# Patient Record
Sex: Male | Born: 1970
Health system: Southern US, Community
[De-identification: ages and names within clinical notes are randomized; demographics above are authoritative.]

## PROBLEM LIST (undated history)

## (undated) DIAGNOSIS — F329 Major depressive disorder, single episode, unspecified: Secondary | ICD-10-CM

## (undated) DIAGNOSIS — F32A Depression, unspecified: Secondary | ICD-10-CM

## (undated) DIAGNOSIS — F419 Anxiety disorder, unspecified: Secondary | ICD-10-CM

## (undated) HISTORY — PX: NO PAST SURGERIES: SHX2092

---

## 2003-05-22 ENCOUNTER — Emergency Department (HOSPITAL_COMMUNITY): Admission: EM | Admit: 2003-05-22 | Discharge: 2003-05-22 | Payer: Self-pay | Admitting: Emergency Medicine

## 2007-08-21 ENCOUNTER — Emergency Department (HOSPITAL_COMMUNITY): Admission: EM | Admit: 2007-08-21 | Discharge: 2007-08-21 | Payer: Self-pay | Admitting: Emergency Medicine

## 2007-12-16 ENCOUNTER — Emergency Department (HOSPITAL_COMMUNITY): Admission: EM | Admit: 2007-12-16 | Discharge: 2007-12-16 | Payer: Self-pay | Admitting: Emergency Medicine

## 2008-10-28 IMAGING — CR DG CHEST 1V PORT
1 series · 1 of 1 positions shown · non-contrast
Comparison: None.

CLINICAL DATA: Shortness breath.]

PORTABLE CHEST - 1 VIEW

[view not recorded]
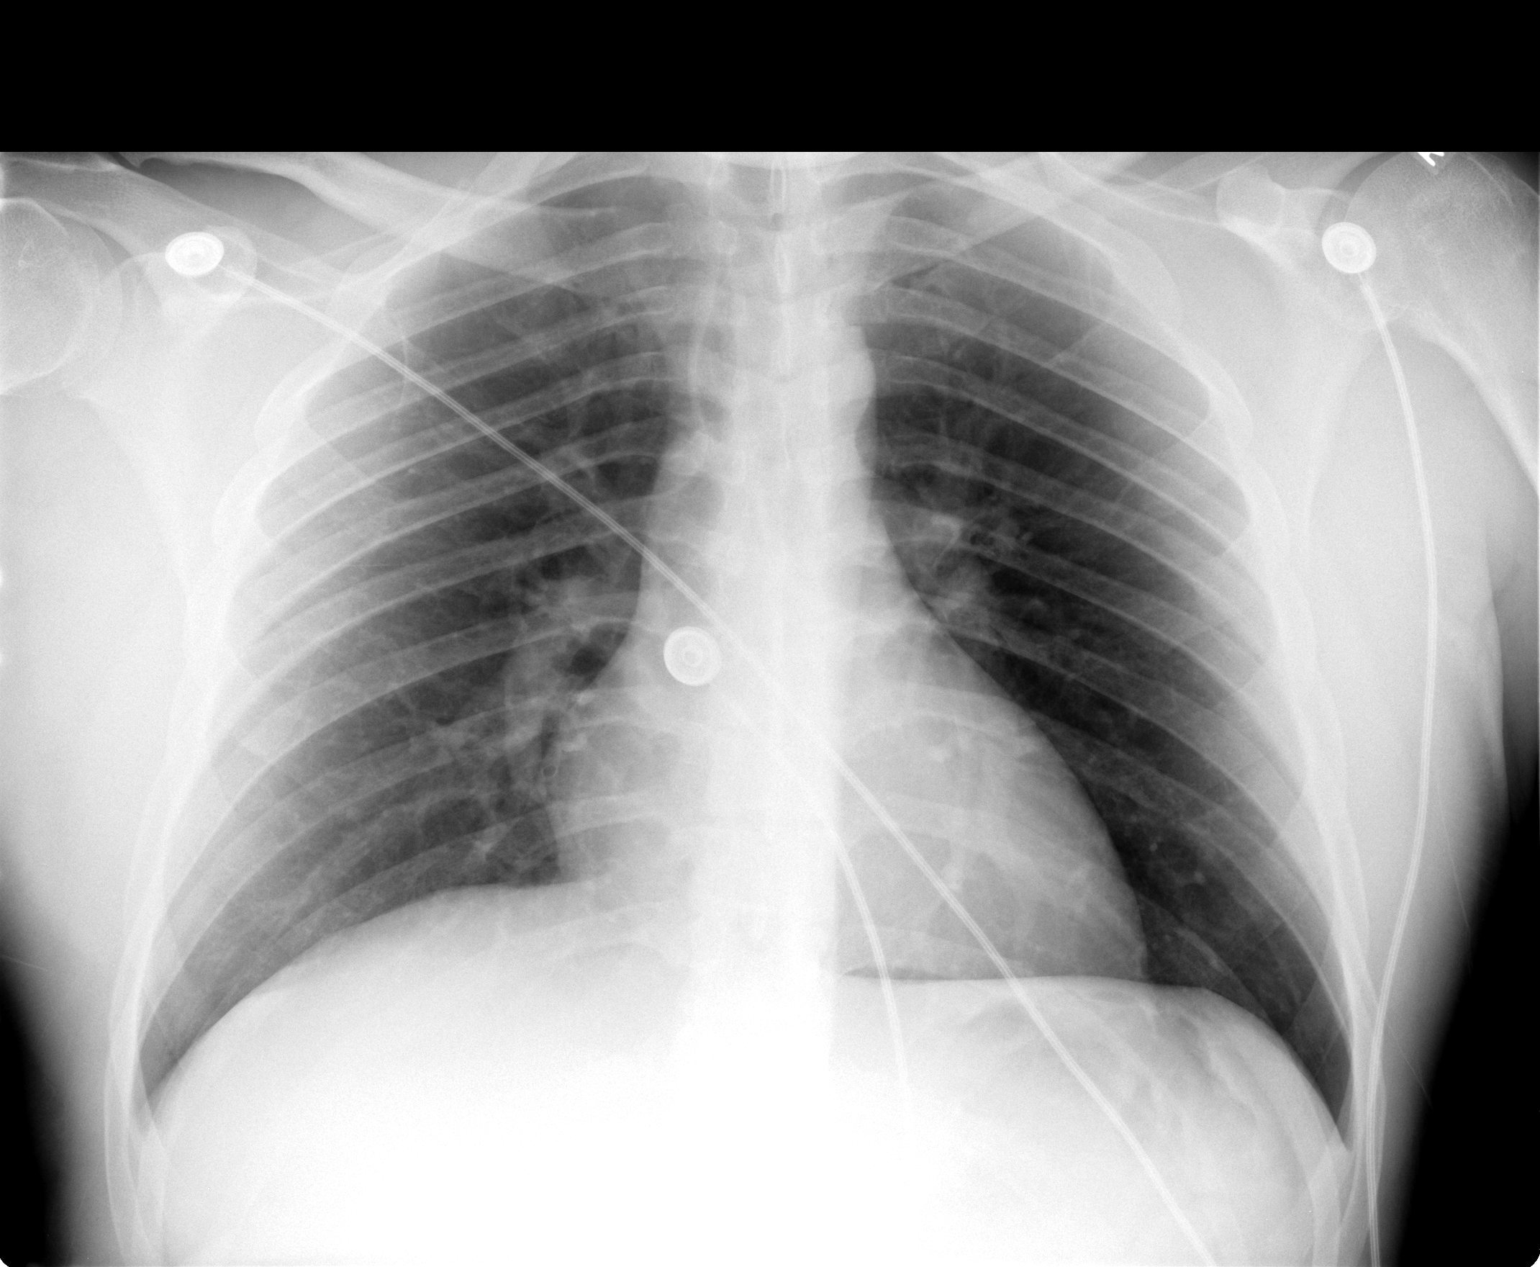

[1 of 1 positions shown; findings below may reference images not displayed]

FINDINGS: No infiltrate, congestive heart failure or pneumothorax.
Question 1.2 cm nodule left lung apex versus overlapping
structures.  This can be evaluated on follow-up two-view exam.

Heart size top normal.  Mediastinal structures within normal
limits.
IMPRESSION: No infiltrate, congestive heart failure or pneumothorax.

Question 1.2 cm left apical nodule versus overlapping structures.
Follow-up two-view exam recommended for further delineation.

## 2009-10-19 ENCOUNTER — Emergency Department (HOSPITAL_COMMUNITY): Admission: EM | Admit: 2009-10-19 | Discharge: 2009-10-20 | Payer: Self-pay | Admitting: Emergency Medicine

## 2011-01-22 LAB — COMPREHENSIVE METABOLIC PANEL
Albumin: 3.9
BUN: 13
Creatinine, Ser: 1.38
GFR calc Af Amer: >60
Potassium: 3.9
Total Protein: 6.9

## 2011-01-22 LAB — POCT CARDIAC MARKERS
CKMB, poc: <1 — ABNORMAL LOW
Myoglobin, poc: 36.2
Operator id: 3067
Troponin i, poc: <0.05

## 2011-01-22 LAB — CBC
HCT: 40.3
MCV: 89.1
Platelets: 208
RDW: 13.8

## 2011-01-22 LAB — DIFFERENTIAL
Lymphocytes Relative: 28
Monocytes Absolute: 0.6
Monocytes Relative: 9
Neutro Abs: 4.2

## 2011-01-22 LAB — APTT: aPTT: 30

## 2015-10-08 ENCOUNTER — Emergency Department (HOSPITAL_COMMUNITY)
Admission: EM | Admit: 2015-10-08 | Discharge: 2015-10-08 | Disposition: A | Discharge status: Home / Self Care | Payer: Self-pay | Attending: Emergency Medicine | Admitting: Emergency Medicine

## 2015-10-08 ENCOUNTER — Encounter (HOSPITAL_COMMUNITY): Payer: Self-pay | Admitting: Emergency Medicine

## 2015-10-08 DIAGNOSIS — K029 Dental caries, unspecified: Secondary | ICD-10-CM

## 2015-10-08 DIAGNOSIS — Z720 Tobacco use: Secondary | ICD-10-CM

## 2015-10-08 DIAGNOSIS — K047 Periapical abscess without sinus: Secondary | ICD-10-CM

## 2015-10-08 MED ORDER — HYDROCODONE-ACETAMINOPHEN 5-325 MG PO TABS: 1.0000 | ORAL_TABLET | Freq: Four times a day (QID) | ORAL | Status: DC | PRN

## 2015-10-08 MED ORDER — NAPROXEN 500 MG PO TABS: 500.0000 mg | ORAL_TABLET | Freq: Two times a day (BID) | ORAL | Status: DC | PRN

## 2015-10-08 MED ORDER — AMOXICILLIN-POT CLAVULANATE 875-125 MG PO TABS: 1.0000 | ORAL_TABLET | Freq: Two times a day (BID) | ORAL | Status: DC

## 2015-10-08 NOTE — ED Provider Notes (Signed)
CSN: [id number]     Arrival date & time 10/08/15  1313 History  By signing my name below, I, Linna Darner, attest that this documentation has been prepared under the direction and in the presence of [address] [employer]. Electronically Signed: Linna Darner, Scribe. 10/08/2015. 1:25 PM.   Chief Complaint  Patient presents with  . Dental Pain    Patient is a 45 y.o. male presenting with tooth pain. The history is provided by the patient. No language interpreter was used.  Dental Pain Location:  Lower Lower teeth location:  18/LL 2nd molar Quality:  Throbbing and radiating Severity:  Severe Onset quality:  Gradual Duration:  1 week Timing:  Constant Progression:  Worsening Chronicity:  New Context: dental fracture   Relieved by:  NSAIDs and topical anesthetic gel Exacerbated by: chewing left side. Ineffective treatments:  None tried Associated symptoms: facial pain   Associated symptoms: no difficulty swallowing, no drooling, no facial swelling, no fever, no gum swelling, no neck swelling and no trismus   Risk factors: lack of dental care and smoking      HPI Comments: Zachary Lindsey is a 45 y.o. otherwise healthy male who presents to the Emergency Department complaining of constant, worsening, throbbing, 9/10 left lower dental pain radiating throughout the left side of his face for the last two weeks, worse over the last 48hrs. He states that he chipped the painful tooth several months ago. Pt states that his pain is exacerbated by chewing on his left side. He has tried [employer], Orajel, and ibuprofen for his pain; he states that these treatments provided moderate relief of his pain initially but have not worked recently. Pt does not currently have a dentist or dental insurance; his last visit to a dentist was 3 to 4 years ago. Pt is a smoker. He denies drainage from his gums, swelling in his gums, facial/neck swelling, drooling, trismus,  trouble swallowing, ear pain, ear  drainage, fever, chills, CP, SOB, abdominal pain, nausea, vomiting, diarrhea, constipation, dysuria, hematuria, numbness/tingling, weakness, rash, myalgias, arthralgias, or any other associated symptoms.  History reviewed. No pertinent past medical history. No past surgical history on file. History reviewed. No pertinent family history. Social History  Substance Use Topics  . Smoking status: None  . Smokeless tobacco: None  . Alcohol Use: None    Review of Systems  Constitutional: Negative for fever and chills.  HENT: Positive for dental problem (left lower molar #18). Negative for drooling, ear discharge, ear pain, facial swelling and trouble swallowing.   Respiratory: Negative for shortness of breath.   Cardiovascular: Negative for chest pain.  Gastrointestinal: Negative for nausea, vomiting, abdominal pain, diarrhea and constipation.  Genitourinary: Negative for dysuria and hematuria.  Musculoskeletal: Negative for myalgias and arthralgias.  Skin: Negative for color change and rash.  Allergic/Immunologic: Negative for immunocompromised state.  Neurological: Negative for weakness and numbness.  Psychiatric/Behavioral: Negative for confusion.   10 Systems reviewed and all are negative for acute change except as noted in the HPI.  Allergies  Review of patient's allergies indicates no known allergies.  Home Medications   Prior to Admission medications   Not on File   BP 132/80 mmHg  Pulse 104  Temp(Src) 98.7 F (37.1 C) (Oral)  Resp 18  SpO2 97% Physical Exam  Constitutional: He is oriented to person, place, and time. Vital signs are normal. He appears well-developed and well-nourished.  Non-toxic appearance. No distress.  Afebrile, nontoxic, NAD  HENT:  Head: Normocephalic and atraumatic.  Nose: Nose normal.  Mouth/Throat: Uvula is midline, oropharynx is clear and moist and mucous membranes are normal. No trismus in the jaw. Dental caries present. No dental abscesses or  uvula swelling.    Left lower molar #18 decayed and mildly TTP, with no surrounding gingival erythema or swelling, no abscess or evidence of Ludwig's. Nose clear. Oropharynx clear and moist, without uvular swelling or deviation, no trismus or drooling, no tonsillar swelling or erythema, no exudates.   Eyes: Conjunctivae and EOM are normal. Right eye exhibits no discharge. Left eye exhibits no discharge.  Neck: Normal range of motion. Neck supple.  Cardiovascular: Normal rate.   Pulmonary/Chest: Effort normal. No respiratory distress.  Abdominal: Normal appearance. He exhibits no distension.  Musculoskeletal: Normal range of motion.  Neurological: He is alert and oriented to person, place, and time. He has normal strength. No sensory deficit.  Skin: Skin is warm, dry and intact. No rash noted.  Psychiatric: He has a normal mood and affect.  Nursing note and vitals reviewed.   ED Course  Procedures (including critical care time)  DIAGNOSTIC STUDIES: Oxygen Saturation is 97% on RA, normal by my interpretation.    COORDINATION OF CARE: 1:25 PM Discussed treatment plan with pt at bedside and pt agreed to plan.  Labs Review Labs Reviewed - No data to display  Imaging Review No results found. I have personally reviewed and evaluated these images and lab results as part of my medical decision-making.   EKG Interpretation None      MDM   Final diagnoses:  Pain due to dental caries  Dental infection  Tobacco use    45 y.o. male here with Dental pain associated with dental decay and possible dental infection with patient afebrile, non toxic appearing and swallowing secretions well. I gave patient referral to dentist and stressed the importance of dental follow up for ultimate management of dental pain.  I have also discussed reasons to return immediately to the ER.  Patient expresses understanding and agrees with plan.  I will also give augmentin and pain control. Smoking cessation  advised.   I personally performed the services described in this documentation, which was scribed in my presence. The recorded information has been reviewed and is accurate.  BP 132/80 mmHg  Pulse 104  Temp(Src) 98.7 F (37.1 C) (Oral)  Resp 18  SpO2 97%  Meds ordered this encounter  Medications  . amoxicillin-clavulanate (AUGMENTIN) 875-125 MG tablet    Sig: Take 1 tablet by mouth 2 (two) times daily. One po bid x 7 days    Dispense:  14 tablet    Refill:  0    Order Specific Question:  Supervising Provider    Answer:  Hyacinth Meeker, BRIAN [[id number]]  . HYDROcodone-acetaminophen (NORCO) 5-325 MG tablet    Sig: Take 1 tablet by mouth every 6 (six) hours as needed for severe pain.    Dispense:  6 tablet    Refill:  0    Order Specific Question:  Supervising Provider    Answer:  MILLER, BRIAN [[id number]]  . naproxen (NAPROSYN) 500 MG tablet    Sig: Take 1 tablet (500 mg total) by mouth 2 (two) times daily as needed for mild pain, moderate pain or headache (TAKE WITH MEALS.).    Dispense:  20 tablet    Refill:  0    Order Specific Question:  Supervising Provider    Answer:  Eber Hong [[id number]]     Manvir Prabhu Camprubi-Soms, PA-C 10/08/15 1344  Nicholos Johns  Clarene DukeMcManus, DO 10/11/15 2258

## 2015-10-08 NOTE — ED Notes (Signed)
Pt states he has had L upper dental pain x 1 week where a tooth chipped off approx. 6-7 months ago. Worsening pain in past 48 hours. Alert and oriented.

## 2015-10-08 NOTE — Discharge Instructions (Signed)
Apply warm compresses to jaw throughout the day. Take antibiotic until finished. Use tylenol or motrin as needed for pain, using norco as directed as needed for additional pain relief but don't drive or operate machinery while taking this medication. Perform salt water swishes to help with pain/swelling. Followup with a dentist is very important for ongoing evaluation and management of recurrent dental pain, call the dentist listed above in the next 24-48 hours to schedule ongoing dental care, or use the list below to find a dentist. Stop smoking! Return to emergency department for emergent changing or worsening symptoms.   Dental Caries Dental caries is tooth decay. This decay can cause a hole in teeth (cavity) that can get bigger and deeper over time. HOME CARE  Brush and floss your teeth. Do this at least two times a day.  Use a fluoride toothpaste.  Use a mouth rinse if told by your dentist or doctor.  Eat less sugary and starchy foods. Drink less sugary drinks.  Avoid snacking often on sugary and starchy foods. Avoid sipping often on sugary drinks.  Keep regular checkups and cleanings with your dentist.  Use fluoride supplements if told by your dentist or doctor.  Allow fluoride to be applied to teeth if told by your dentist or doctor.   This information is not intended to replace advice given to you by your health care provider. Make sure you discuss any questions you have with your health care provider.   Document Released: 01/23/2008 Document Revised: 05/06/2014 Document Reviewed: 04/17/2012 Elsevier Interactive Patient Education 2016 Elsevier Inc.  Dental Pain Dental pain may be caused by many things, including:  Tooth decay (cavities or caries). Cavities cause the nerve of your tooth to be open to air and hot or cold temperatures. This can cause pain or discomfort.  Abscess or infection. A dental abscess is an area that is full of infected pus from a bacterial infection in  the inner part of the tooth (pulp). It usually happens at the end of the tooth's root.  Injury.  An unknown reason (idiopathic). Your pain may be mild or severe. It may only happen when:  You are chewing.  You are exposed to hot or cold temperature.  You are eating or drinking sugary foods or beverages, such as:  Soda.  Candy. Your pain may also be there all of the time. HOME CARE Watch your dental pain for any changes. Do these things to lessen your discomfort:  Take medicines only as told by your dentist.  If your dentist tells you to take an antibiotic medicine, finish all of it even if you start to feel better.  Keep all follow-up visits as told by your dentist. This is important.  Do not apply heat to the outside of your face.  Rinse your mouth or gargle with salt water if told by your dentist. This helps with pain and swelling.  You can make salt water by adding  tsp of salt to 1 cup of warm water.  Apply ice to the painful area of your face:  Put ice in a plastic bag.  Place a towel between your skin and the bag.  Leave the ice on for 20 minutes, 2-3 times per day.  Avoid foods or drinks that cause you pain, such as:  Very hot or very cold foods or drinks.  Sweet or sugary foods or drinks. GET HELP IF:  Your pain is not helped with medicines.  Your symptoms are worse.  You  have new symptoms. GET HELP RIGHT AWAY IF:  You cannot open your mouth.  You are having trouble breathing or swallowing.  You have a fever.  Your face, neck, or jaw is puffy (swollen).   This information is not intended to replace advice given to you by your health care provider. Make sure you discuss any questions you have with your health care provider.   Document Released: 10/02/2007 Document Revised: 08/30/2014 Document Reviewed: 04/11/2014 Elsevier Interactive Patient Education 2016 [employer]  Smoking Cessation, Tips for Success If you are ready to quit smoking,  congratulations! You have chosen to help yourself be healthier. Cigarettes bring nicotine, tar, carbon monoxide, and other irritants into your body. Your lungs, heart, and blood vessels will be able to work better without these poisons. There are many different ways to quit smoking. Nicotine gum, nicotine patches, a nicotine inhaler, or nicotine nasal spray can help with physical craving. Hypnosis, support groups, and medicines help break the habit of smoking. WHAT THINGS CAN I DO TO MAKE QUITTING EASIER?  Here are some tips to help you quit for good:  Pick a date when you will quit smoking completely. Tell all of your friends and family about your plan to quit on that date.  Do not try to slowly cut down on the number of cigarettes you are smoking. Pick a quit date and quit smoking completely starting on that day.  Throw away all cigarettes.   Clean and remove all ashtrays from your home, work, and car.  On a card, write down your reasons for quitting. Carry the card with you and read it when you get the urge to smoke.  Cleanse your body of nicotine. Drink enough water and fluids to keep your urine clear or pale yellow. Do this after quitting to flush the nicotine from your body.  Learn to predict your moods. Do not let a bad situation be your excuse to have a cigarette. Some situations in your life might tempt you into wanting a cigarette.  Never have "just one" cigarette. It leads to wanting another and another. Remind yourself of your decision to quit.  Change habits associated with smoking. If you smoked while driving or when feeling stressed, try other activities to replace smoking. Stand up when drinking your coffee. Brush your teeth after eating. Sit in a different chair when you read the paper. Avoid alcohol while trying to quit, and try to drink fewer caffeinated beverages. Alcohol and caffeine may urge you to smoke.  Avoid foods and drinks that can trigger a desire to smoke, such as  sugary or spicy foods and alcohol.  Ask people who smoke not to smoke around you.  Have something planned to do right after eating or having a cup of coffee. For example, plan to take a walk or exercise.  Try a relaxation exercise to calm you down and decrease your stress. Remember, you may be tense and nervous for the first 2 weeks after you quit, but this will pass.  Find new activities to keep your hands busy. Play with a pen, coin, or rubber band. Doodle or draw things on paper.  Brush your teeth right after eating. This will help cut down on the craving for the taste of tobacco after meals. You can also try mouthwash.   Use oral substitutes in place of cigarettes. Try using lemon drops, carrots, cinnamon sticks, or chewing gum. Keep them handy so they are available when you have the urge to smoke.  When you have the urge to smoke, try deep breathing.  Designate your home as a nonsmoking area.  If you are a heavy smoker, ask your health care provider about a prescription for nicotine chewing gum. It can ease your withdrawal from nicotine.  Reward yourself. Set aside the cigarette money you save and buy yourself something nice.  Look for support from others. Join a support group or smoking cessation program. Ask someone at home or at work to help you with your plan to quit smoking.  Always ask yourself, "Do I need this cigarette or is this just a reflex?" Tell yourself, "Today, I choose not to smoke," or "I do not want to smoke." You are reminding yourself of your decision to quit.  Do not replace cigarette smoking with electronic cigarettes (commonly called e-cigarettes). The safety of e-cigarettes is unknown, and some may contain harmful chemicals.  If you relapse, do not give up! Plan ahead and think about what you will do the next time you get the urge to smoke. HOW WILL I FEEL WHEN I QUIT SMOKING? You may have symptoms of withdrawal because your body is used to nicotine (the  addictive substance in cigarettes). You may crave cigarettes, be irritable, feel very hungry, cough often, get headaches, or have difficulty concentrating. The withdrawal symptoms are only temporary. They are strongest when you first quit but will go away within 10-14 days. When withdrawal symptoms occur, stay in control. Think about your reasons for quitting. Remind yourself that these are signs that your body is healing and getting used to being without cigarettes. Remember that withdrawal symptoms are easier to treat than the major diseases that smoking can cause.  Even after the withdrawal is over, expect periodic urges to smoke. However, these cravings are generally short lived and will go away whether you smoke or not. Do not smoke! WHAT RESOURCES ARE AVAILABLE TO HELP ME QUIT SMOKING? Your health care provider can direct you to community resources or hospitals for support, which may include:  Group support.  Education.  Hypnosis.  Therapy.   This information is not intended to replace advice given to you by your health care provider. Make sure you discuss any questions you have with your health care provider.   Document Released: 01/12/2004 Document Revised: 05/06/2014 Document Reviewed: 10/01/2012 Elsevier Interactive Patient Education 2016 [employer].   Emergency Department Resource Guide 1) Find a Doctor and Pay Out of Pocket Although you won't have to find out who is covered by your insurance plan, it is a good idea to ask around and get recommendations. You will then need to call the office and see if the doctor you have chosen will accept you as a new patient and what types of options they offer for patients who are self-pay. Some doctors offer discounts or will set up payment plans for their patients who do not have insurance, but you will need to ask so you aren't surprised when you get to your appointment.  2) Contact Your Local Health Department Not all health departments  have doctors that can see patients for sick visits, but many do, so it is worth a call to see if yours does. If you don't know where your local health department is, you can check in your phone book. The CDC also has a tool to help you locate your state's health department, and many state websites also have listings of all of their local health departments.  3) Find a Walk-in Clinic If  your illness is not likely to be very severe or complicated, you may want to try a walk in clinic. These are popping up all over the country in pharmacies, drugstores, and shopping centers. They're usually staffed by nurse practitioners or physician assistants that have been trained to treat common illnesses and complaints. They're usually fairly quick and inexpensive. However, if you have serious medical issues or chronic medical problems, these are probably not your best option.  No Primary Care Doctor: - Call Health Connect at  [phone] - they can help you locate a primary care doctor that  accepts your insurance, provides certain services, etc. - Physician Referral Service- [phone]  Chronic Pain Problems: Organization         Address  Phone   Notes  [facility]  [phone] Patients need to be referred by their primary care doctor.   Medication Assistance: Organization         Address  Phone   Notes  [facility] Medication [facility] [address] [place] [place], [place] [address] [phone] --Must be a resident of [facility] -- Must have NO insurance coverage whatsoever (no Medicaid/ Medicare, etc.) -- The pt. MUST have a primary care doctor that directs their care regularly and follows them in the community   [facility]  [phone]   [place]  [phone]     Dental Care: Organization         Address  Phone  Notes  [facility] Department of [facility] [address] [place], [place] [phone] Accepts  children up to age 69 who are enrolled in [place] or [place] Health Choice; pregnant women with a Medicaid card; and children who have applied for Medicaid or [place] Health Choice, but were declined, whose parents can pay a reduced fee at time of service.  [facility] Department of [facility]  [address], [place] [phone] Accepts children up to age 30 who are enrolled in [place] or [place] Health Choice; pregnant women with a Medicaid card; and children who have applied for Medicaid or [place] Health Choice, but were declined, whose parents can pay a reduced fee at time of service.  [facility]  [address] [place], [place] [phone] Patients are seen by appointment only. Walk-ins are not accepted. [facility] will see patients 12 years of age and older. Monday - Tuesday (8am-5pm) Most Wednesdays (8:30-5pm) $30 per visit, cash only  [facility]  [address], [facility] [phone] Patients are seen by appointment only. Walk-ins are not accepted. [facility] will see patients 57 years of age and older. One Wednesday Evening (Monthly: Volunteer Based).  $30 per visit, cash only  [employer]  [phone] for adults; Children under age 53, call Graduate Pediatric Dentistry at [phone]. Children aged 45-14, please call [phone] to request a pediatric application.  Dental services are provided in all areas of dental care including fillings, crowns and bridges, complete and partial dentures, implants, gum treatment, root canals, and extractions. Preventive care is also provided. Treatment is provided to both adults and children. Patients are selected via a lottery and there is often a waiting list.   [facility] [address] [place], [place]  [phone] [email or web address]   [facility] [address] [place], [place] [phone], Ext. [phone] Second and  Fourth Thursday of each month, opens at 6:30 AM; Clinic ends at 9 AM.  Patients are seen on a first-come first-served basis, and a limited number are seen during each clinic.   [facility]  [address] [place], [place] [phone]   Eligibility Requirements You must have lived in [place], [place], or [place] counties for at least the last three months.   You cannot be eligible for state or federal sponsored [place], including [employer], [place], or [employer].   You generally cannot be eligible for healthcare insurance through your employer.    How to apply: Eligibility screenings are held every Tuesday and Wednesday afternoon from 1:00 pm until 4:00 pm. You do not need an appointment for the interview!  [facility] [address], [place], [place] [phone]   [facility]  [phone]   [facility]  [phone]   [facility]  [phone]

## 2016-07-12 ENCOUNTER — Encounter (HOSPITAL_COMMUNITY): Payer: Self-pay

## 2016-07-12 ENCOUNTER — Emergency Department (HOSPITAL_COMMUNITY)
Admission: EM | Admit: 2016-07-12 | Discharge: 2016-07-12 | Disposition: A | Discharge status: Home / Self Care | Payer: Self-pay | Attending: Emergency Medicine | Admitting: Emergency Medicine

## 2016-07-12 DIAGNOSIS — F17200 Nicotine dependence, unspecified, uncomplicated: Secondary | ICD-10-CM | POA: Insufficient documentation

## 2016-07-12 DIAGNOSIS — Z79899 Other long term (current) drug therapy: Secondary | ICD-10-CM | POA: Insufficient documentation

## 2016-07-12 DIAGNOSIS — Z760 Encounter for issue of repeat prescription: Secondary | ICD-10-CM | POA: Insufficient documentation

## 2016-07-12 HISTORY — DX: Anxiety disorder, unspecified: F41.9

## 2016-07-12 HISTORY — DX: Major depressive disorder, single episode, unspecified: F32.9

## 2016-07-12 HISTORY — DX: Depression, unspecified: F32.A

## 2016-07-12 MED ORDER — BUPROPION HCL ER (SR) 150 MG PO TB12: 150.0000 mg | ORAL_TABLET | Freq: Two times a day (BID) | ORAL | 0 refills | Status: DC

## 2016-07-12 MED ORDER — BUPROPION HCL ER (SR) 150 MG PO TB12: 150.0000 mg | ORAL_TABLET | Freq: Every day | ORAL | 0 refills | Status: DC

## 2016-07-12 MED ORDER — VENLAFAXINE HCL ER 75 MG PO CP24: 225.0000 mg | ORAL_CAPSULE | Freq: Every day | ORAL | 0 refills | Status: DC

## 2016-07-12 MED ORDER — VENLAFAXINE HCL ER 75 MG PO CP24: 75.0000 mg | ORAL_CAPSULE | Freq: Every day | ORAL | 0 refills | Status: DC

## 2016-07-12 NOTE — ED Triage Notes (Signed)
Pt out of meds.  Normally goes to md for his routine med scripts.  However, recently lost insurance and cannot go to appt.  Must have appt to get refill.

## 2016-07-12 NOTE — Discharge Instructions (Signed)
Please take medications as prescribed.  It is important that you establish care with a primary care provider for routine medical care and long term management and monitoring of your medications, anxiety and depression.   Please contact [facility] to establish care with a primary care provider.   Return to the emergency department if your anxiety or depression worsen, if you have thoughts of suicide, homicide or have desire to harm yourself or others

## 2016-07-12 NOTE — ED Provider Notes (Signed)
[facility]-EMER[facility]Y DEPT Provider Note   CSN: [id number] Arrival date & time: 07/12/16  1304  By signing my name below, I, Sonum Patel, attest that this documentation has been prepared under the direction and in the presence of Sharen Heck, PA-C. Electronically Signed: Leone Payor, Scribe. 07/12/16. 1:36 PM.  History   Chief Complaint Chief Complaint  Patient presents with  . Medication Refill    The history is provided by the patient. No language interpreter was used.    HPI Comments: Zachary Lindsey is a 46 y.o. male who presents to the Emergency Department today requesting medication refills for bupropion and venlafaxine. Patient states he lost his insurance in January 2017 but had enough refills to last him until the end of last year. He states he called his previous PCP, Gildardo Cranker, who gave him 1 more refill to last him until recently. He reports recently being hired at a job that provides him health benefits but this has not yet activated for him. He states the does of the medications are: bupropion 150 QD, venlafaxine 75 mg 3 tables QD. He states he has not taken the venlafaxine in 4 days and only has a few bupropion remaining. He states these medications were prescribed to him due to anxiety and depression. He reports having mild anxiety but has improved since regaining employment. He denies SI, HI, worsening anxious or depressive mod, auditory or visual hallucinations. He denies prior attempts of self-harm.   Past Medical History:  Diagnosis Date  . Anxiety   . Depression     There are no active problems to display for this patient.   History reviewed. No pertinent surgical history.     Home Medications    Prior to Admission medications   Medication Sig Start Date End Date Taking? Authorizing Provider  amoxicillin-clavulanate (AUGMENTIN) 875-125 MG tablet Take 1 tablet by mouth 2 (two) times daily. One po bid x 7 days 10/08/15   Mercedes Street, PA-C  buPROPion  [facility] SR) 150 MG 12 hr tablet Take 1 tablet (150 mg total) by mouth daily. 07/12/16 08/11/16  Liberty Handy, PA-C  HYDROcodone-acetaminophen (NORCO) 5-325 MG tablet Take 1 tablet by mouth every 6 (six) hours as needed for severe pain. 10/08/15   Mercedes Street, PA-C  naproxen (NAPROSYN) 500 MG tablet Take 1 tablet (500 mg total) by mouth 2 (two) times daily as needed for mild pain, moderate pain or headache (TAKE WITH MEALS.). 10/08/15   Mercedes Street, PA-C  venlafaxine XR (EFFEXOR XR) 75 MG 24 hr capsule Take 3 capsules (225 mg total) by mouth daily with breakfast. 07/12/16 08/11/16  Liberty Handy, PA-C    Family History History reviewed. No pertinent family history.  Social History Social History  Substance Use Topics  . Smoking status: Current Every Day Smoker  . Smokeless tobacco: Never Used  . Alcohol use No     Allergies   Patient has no known allergies.   Review of Systems Review of Systems  Constitutional:       +medication refill  Psychiatric/Behavioral: Negative for dysphoric mood, hallucinations, self-injury, sleep disturbance and suicidal ideas. The patient is not nervous/anxious.      Physical Exam Updated Vital Signs BP 117/85 (BP Location: Left Arm)   Pulse 92   Temp 98.9 F (37.2 C) (Oral)   Resp 18   SpO2 97%   Physical Exam  Constitutional: He is oriented to person, place, and time. He appears well-developed and well-nourished. No distress.  HENT:  Head: Normocephalic and atraumatic.  Eyes: Conjunctivae are normal. No scleral icterus.  Neck: Normal range of motion. No tracheal deviation present.  Cardiovascular: Normal rate, regular rhythm, normal heart sounds and intact distal pulses.   No murmur heard. Pulmonary/Chest: Effort normal and breath sounds normal. He has no wheezes.  Musculoskeletal: Normal range of motion. He exhibits no deformity.  Neurological: He is alert and oriented to person, place, and time.  Skin: Skin is warm and  dry.  Psychiatric: He has a normal mood and affect. His speech is normal and behavior is normal. Judgment and thought content normal. His mood appears not anxious. His affect is not labile. He is not withdrawn. He does not exhibit a depressed mood. He expresses no homicidal and no suicidal ideation. He expresses no suicidal plans and no homicidal plans.  Nursing note and vitals reviewed.    ED Treatments / Results  DIAGNOSTIC STUDIES: Oxygen Saturation is 97% on RA, normal by my interpretation.    COORDINATION OF CARE: 1:36 PM Discussed treatment plan with pt at bedside and pt agreed to plan.   Labs (all labs ordered are listed, but only abnormal results are displayed) Labs Reviewed - No data to display  EKG  EKG Interpretation None       Radiology No results found.  Procedures Procedures (including critical care time)  Medications Ordered in ED Medications - No data to display   Initial Impression / Assessment and Plan / ED Course  I have reviewed the triage vital signs and the nursing notes.  Pertinent labs & imaging results that were available during my care of the patient were reviewed by me and considered in my medical decision making (see chart for details).    46 year old male with diagnosed anxiety and depression, stable without worsening anxious or depressive mood, suicidal ideations, homicidal ideations, hallucinations presents to ED requesting refill of his Effexor and bupropion. Patient's mood, affect, speech and behavior are appropriate. No active symptoms of worsening anxiety or depression. No known symptoms or signs suggestive of adverse effects to medications. Patient appears to be reliable, is working on establishing care with primary care provider once his health insurance activates. Will discharge patient with refill on his anxiety and depression medications today. I advised patient that he [facility],  patient verbalized understanding and is agreeable.  Final Clinical Impressions(s) / ED Diagnoses   Final diagnoses:  Medication refill    New Prescriptions Discharge Medication List as of 07/12/2016  1:41 PM    START taking these medications   Details  buPROPion (WELLBUTRIN SR) 150 MG 12 hr tablet Take 1 tablet (150 mg total) by mouth 2 (two) times daily., Starting Fri 07/12/2016, Until Sun 08/11/2016, Print    venlafaxine XR (EFFEXOR XR) 75 MG 24 hr capsule Take 1 capsule (75 mg total) by mouth daily with breakfast., Starting Fri 07/12/2016, Until Sun 08/11/2016, Print       I personally performed the services described in this documentation, which was scribed in my presence. The recorded information has been reviewed and is accurate.    Liberty HandyClaudia J Herman Fiero, PA-C 07/12/16 1401    Azalia BilisKevin Campos, MD 07/12/16 1536

## 2016-07-12 NOTE — ED Notes (Signed)
Bed: WTR8 Expected date:  Expected time:  Means of arrival:  Comments: 

## 2016-08-23 ENCOUNTER — Encounter (HOSPITAL_COMMUNITY): Payer: Self-pay

## 2016-08-23 ENCOUNTER — Emergency Department (HOSPITAL_COMMUNITY)
Admission: EM | Admit: 2016-08-23 | Discharge: 2016-08-23 | Disposition: A | Discharge status: Home / Self Care | Payer: Self-pay | Attending: Emergency Medicine | Admitting: Emergency Medicine

## 2016-08-23 DIAGNOSIS — F17200 Nicotine dependence, unspecified, uncomplicated: Secondary | ICD-10-CM | POA: Insufficient documentation

## 2016-08-23 DIAGNOSIS — Z760 Encounter for issue of repeat prescription: Secondary | ICD-10-CM | POA: Insufficient documentation

## 2016-08-23 DIAGNOSIS — Z79899 Other long term (current) drug therapy: Secondary | ICD-10-CM | POA: Insufficient documentation

## 2016-08-23 MED ORDER — VENLAFAXINE HCL ER 75 MG PO CP24: 225.0000 mg | ORAL_CAPSULE | Freq: Every day | ORAL | 0 refills | Status: DC

## 2016-08-23 MED ORDER — BUPROPION HCL ER (SR) 150 MG PO TB12: 150.0000 mg | ORAL_TABLET | Freq: Every day | ORAL | 0 refills | Status: DC

## 2016-08-23 NOTE — ED Provider Notes (Signed)
[facility] Provider Note   CSN: [id number] Arrival date & time: 08/23/16  1438  By signing my name below, I, Majel Homer, attest that this documentation has been prepared under the direction and in the presence of Maxwell Caul, PA-C . Electronically Signed: Majel Homer, Scribe. 08/23/2016. 3:54 PM.  History   Chief Complaint Chief Complaint  Patient presents with  . Medication Refill   The history is provided by the patient. No language interpreter was used.   HPI Comments: Zachary Lindsey is a 46 y.o. male with PMHx of anxiety and depression, who presents to the Emergency Department requesting a medication refill of his Effexor and Wellbutrin. Pt reports he recently ran out of his medications 4 days ago and cannot schedule an appointment with his PCP at [facility] until 09/02/16. He states he has experienced increased anxiety since running out of his medication but denies any SI/HI, visual disturbances, recent sickness, fever, chills, cough, congestion, shortness of breath, and chest pain.   Past Medical History:  Diagnosis Date  . Anxiety   . Depression    There are no active problems to display for this patient.  History reviewed. No pertinent surgical history.  Home Medications    Prior to Admission medications   Medication Sig Start Date End Date Taking? Authorizing Provider  amoxicillin-clavulanate (AUGMENTIN) 875-125 MG tablet Take 1 tablet by mouth 2 (two) times daily. One po bid x 7 days 10/08/15   Mercedes Street, PA-C  buPROPion [facility] SR) 150 MG 12 hr tablet Take 1 tablet (150 mg total) by mouth daily. 08/23/16   Maxwell Caul, PA-C  HYDROcodone-acetaminophen (NORCO) 5-325 MG tablet Take 1 tablet by mouth every 6 (six) hours as needed for severe pain. 10/08/15   Mercedes Street, PA-C  naproxen (NAPROSYN) 500 MG tablet Take 1 tablet (500 mg total) by mouth 2 (two) times daily as needed for mild pain, moderate pain or headache (TAKE WITH MEALS.).  10/08/15   Mercedes Street, PA-C  venlafaxine XR (EFFEXOR XR) 75 MG 24 hr capsule Take 3 capsules (225 mg total) by mouth daily with breakfast. 08/23/16   Maxwell Caul, PA-C   Family History History reviewed. No pertinent family history.  Social History Social History  Substance Use Topics  . Smoking status: Current Every Day Smoker  . Smokeless tobacco: Never Used  . Alcohol use No   Allergies   Patient has no known allergies.  Review of Systems Review of Systems  Constitutional: Negative for chills and fever.  HENT: Negative for congestion.   Eyes: Negative for visual disturbance.  Respiratory: Negative for cough and shortness of breath.   Cardiovascular: Negative for chest pain.  Gastrointestinal: Negative for abdominal pain, nausea and vomiting.  Neurological: Negative for headaches.  Psychiatric/Behavioral: Negative for suicidal ideas. The patient is nervous/anxious.   All other systems reviewed and are negative.  Physical Exam Updated Vital Signs BP 120/83   Pulse 80   Temp 98.4 F (36.9 C)   Resp 16   SpO2 98%   Physical Exam  Constitutional: He appears well-developed and well-nourished.  Sitting comfortably on examination table.  HENT:  Head: Normocephalic and atraumatic.  Eyes: Conjunctivae and EOM are normal. Right eye exhibits no discharge. Left eye exhibits no discharge. No scleral icterus.  Cardiovascular: Normal rate and regular rhythm.   Pulses:      Radial pulses are 2+ on the right side, and 2+ on the left side.  Pulmonary/Chest: Effort normal.  Musculoskeletal: He exhibits no deformity.  Neurological: He is alert.  Skin: Skin is warm and dry.  Psychiatric: He has a normal mood and affect. His speech is normal and behavior is normal. Thought content normal.   ED Treatments / Results  DIAGNOSTIC STUDIES:  Oxygen Saturation is 98% on RA, normal by my interpretation.    COORDINATION OF CARE:  3:50 PM Discussed treatment plan with pt at  bedside and pt agreed to plan.  Labs (all labs ordered are listed, but only abnormal results are displayed) Labs Reviewed - No data to display  EKG  EKG Interpretation None       Radiology No results found.  Procedures Procedures (including critical care time)  Medications Ordered in ED Medications - No data to display  Initial Impression / Assessment and Plan / ED Course  I have reviewed the triage vital signs and the nursing notes.  Pertinent labs & imaging results that were available during my care of the patient were reviewed by me and considered in my medical decision making (see chart for details).     46 year old male with past medical history of anxiety and depression who presents with medication refill. Patient states he has been out of his Effexor for the last 4 days and is running out of his Wellbutrin. He recently lost insurance when he switched jobs. He has since found a new job and reinstated his insurance. [facility]/16 but is unable to be seen before then. He states that since running out of his Effexor 4 days ago he noticed increased anxiety. He denies any SI/HI. Patient very well-appearing on physical exam. No pressured speech or agitation. Patient is afebrile, non-toxic appearing, sitting comfortably on examination table. He has no other complaints at this time. Will plan to give a short course refill of his medication since he has an established appointment on 09/02/2016 with the [facility]. Explained to him that I will provide him with a short course of medication to get him until his next appointment but that after that he needs to have them arrange to continue prescribing it. Patient reports understanding and agreement plan. Return precautions discussed.   Final Clinical Impressions(s) / ED Diagnoses   Final diagnoses:  Medication refill    New Prescriptions New  Prescriptions   BUPROPION (WELLBUTRIN SR) 150 MG 12 HR TABLET    Take 1 tablet (150 mg total) by mouth daily.   VENLAFAXINE XR (EFFEXOR XR) 75 MG 24 HR CAPSULE    Take 3 capsules (225 mg total) by mouth daily with breakfast.   I personally performed the services described in this documentation, which was scribed in my presence. The recorded information has been reviewed and is accurate.     Maxwell Caul, PA-C 08/23/16 1615    Lavera Guise, MD 08/24/16 [phone]

## 2016-08-23 NOTE — Discharge Instructions (Signed)
Follow-up with the doctor that you have previously scheduled appointment with. I will provided with a short course of medication to get a through to the next appointment. But I will have you refill the medications with the doctor that you have an appointment scheduled with.  Return to the emergency department for any worsening anxiety, chest pain, difficulty breathing or any thoughts of wanting to harm or kill yourself.

## 2016-08-23 NOTE — ED Triage Notes (Signed)
Pt is out of behavioral meds.  Pt is seeing effects of being off meds x 6 days.  Has appt next month to establish with new MD.  Shanon Rosser get refill until he sees someone.

## 2016-08-23 NOTE — ED Notes (Signed)
PT DISCHARGED. INSTRUCTIONS AND PRESCRIPTIONS GIVEN. AAOX4. PT IN NO APPARENT DISTRESS OR PAIN. THE OPPORTUNITY TO ASK QUESTIONS WAS PROVIDED. 

## 2016-09-05 ENCOUNTER — Encounter: Payer: Self-pay | Admitting: Physician Assistant

## 2016-09-05 ENCOUNTER — Ambulatory Visit: Payer: Self-pay | Attending: Internal Medicine | Admitting: Physician Assistant

## 2016-09-05 VITALS — BP 120/76 | HR 72 | Temp 98.3°F | Resp 16 | Wt 208.4 lb

## 2016-09-05 DIAGNOSIS — F419 Anxiety disorder, unspecified: Secondary | ICD-10-CM | POA: Insufficient documentation

## 2016-09-05 DIAGNOSIS — Z79899 Other long term (current) drug therapy: Secondary | ICD-10-CM | POA: Insufficient documentation

## 2016-09-05 DIAGNOSIS — F329 Major depressive disorder, single episode, unspecified: Secondary | ICD-10-CM | POA: Insufficient documentation

## 2016-09-05 DIAGNOSIS — F32A Depression, unspecified: Secondary | ICD-10-CM

## 2016-09-05 MED ORDER — VENLAFAXINE HCL ER 75 MG PO CP24
225.0000 mg | ORAL_CAPSULE | Freq: Every day | ORAL | 2 refills | Status: DC
Start: 2016-09-05 — End: 2016-09-05

## 2016-09-05 MED ORDER — BUPROPION HCL ER (SR) 150 MG PO TB12: 150.0000 mg | ORAL_TABLET | Freq: Every day | ORAL | 2 refills | Status: DC

## 2016-09-05 MED ORDER — VENLAFAXINE HCL ER 75 MG PO CP24: 225.0000 mg | ORAL_CAPSULE | Freq: Every day | ORAL | 2 refills | Status: DC

## 2016-09-05 MED FILL — VENLAFAXINE HCL ER 75 MG CA: 75 | 30 days supply | Qty: 90 | Fill #0

## 2016-09-05 MED FILL — ?BUPROPION HCL SR 150 MG TA: 150 | 30 days supply | Qty: 30 | Fill #0

## 2016-09-05 NOTE — Progress Notes (Signed)
Patient ID: BUTLER VEGH, male   DOB: 05-06-1970, 46 y.o.   MRN: [id number]     Zachary Lindsey, is a 46 y.o. male  WUJ:[id number]  [id number]  DOB - 1970/09/28  Subjective:  Chief Complaint and HPI: Zachary Lindsey is a 46 y.o. male here today to establish care and for a follow up visit after being seen in the ED for medication RF of buproprion and effexor.  He lost health insurance and hasn't been able to see his PCP since about 04/2015. Last CPE and bloodwork was about 18 months ago.  PMH unremarkable.  Denies s/sx diabetes.  He had RF that lasted him up until a couple of months ago.  He went to the ED twice to get meds filled because he still didn't have insurance-07/12/2016 and 4/272018.  He has been on the same dose of wellbutrin and effexor for many years.  He says the medications work well to control his anxiety and depression.  Denies SI/HI past or present.  He has a new job now and should have health insurance again soon.   ED/Hospital notes reviewed.    Depression screen PHQ 2/9 09/05/2016  Decreased Interest 0  Down, Depressed, Hopeless 2  PHQ - 2 Score 2  Altered sleeping 0  Tired, decreased energy 1  Change in appetite 1  Feeling bad or failure about yourself  2  Trouble concentrating 2  Moving slowly or fidgety/restless 0  Suicidal thoughts 0  PHQ-9 Score 8   GAD 7 : Generalized Anxiety Score 09/05/2016  Nervous, Anxious, on Edge 2  Control/stop worrying 2  Worry too much - different things 2  Trouble relaxing 2  Restless 2  Easily annoyed or irritable 2  Afraid - awful might happen 2  Total GAD 7 Score 14      ROS:   Constitutional:  No f/c, No night sweats, No unexplained weight loss. EENT:  No vision changes, No blurry vision, No hearing changes. No mouth, throat, or ear problems.  Respiratory: No cough, No SOB Cardiac: No CP, no palpitations GI:  No abd pain, No N/V/D. GU: No Urinary s/sx Musculoskeletal: No joint pain Neuro: No headache, no dizziness, no  motor weakness.  Skin: No rash Endocrine:  No polydipsia. No polyuria.  Psych: Denies SI/HI  No problems updated.  ALLERGIES: No Known Allergies  PAST MEDICAL HISTORY: Past Medical History:  Diagnosis Date  . Anxiety   . Depression     MEDICATIONS AT HOME: Prior to Admission medications   Medication Sig Start Date End Date Taking? Authorizing Provider  buPROPion (WELLBUTRIN SR) 150 MG 12 hr tablet Take 1 tablet (150 mg total) by mouth daily. 09/05/16   Anders Simmonds, PA-C  venlafaxine XR (EFFEXOR XR) 75 MG 24 hr capsule Take 3 capsules (225 mg total) by mouth daily with breakfast. 09/05/16   Anders Simmonds, PA-C     Objective:  EXAM:   Vitals:   09/05/16 1512  BP: 120/76  Pulse: 72  Resp: 16  Temp: 98.3 F (36.8 C)  TempSrc: Oral  SpO2: 96%  Weight: 208 lb 6.4 oz (94.5 kg)    General appearance : A&OX3. NAD. Non-toxic-appearing HEENT: Atraumatic and Normocephalic.  PERRLA. EOM intact.  Neck: supple, no JVD. No cervical lymphadenopathy. No thyromegaly Chest/Lungs:  Breathing-non-labored, Good air entry bilaterally, breath sounds normal without rales, rhonchi, or wheezing  CVS: S1 S2 regular, no murmurs, gallops, rubs  Extremities: Bilateral Lower Ext shows no edema, both legs are warm  to touch with = pulse throughout Neurology:  CN II-XII grossly intact, Non focal.   Psych:  TP linear. J/I WNL. Normal speech. Appropriate eye contact and affect. Mood is stable. Skin:  No Rash  Data Review No results found for: HGBA1C   Assessment & Plan   1. Anxiety - venlafaxine XR (EFFEXOR XR) 75 MG 24 hr capsule; Take 3 capsules (225 mg total) by mouth daily with breakfast.  Dispense: 90 capsule; Refill: 2 - buPROPion (WELLBUTRIN SR) 150 MG 12 hr tablet; Take 1 tablet (150 mg total) by mouth daily.  Dispense: 30 tablet; Refill: 2  2. Depression, unspecified depression type - venlafaxine XR (EFFEXOR XR) 75 MG 24 hr capsule; Take 3 capsules (225 mg total) by mouth  daily with breakfast.  Dispense: 90 capsule; Refill: 2 - buPROPion (WELLBUTRIN SR) 150 MG 12 hr tablet; Take 1 tablet (150 mg total) by mouth daily.  Dispense: 30 tablet; Refill: 2   Patient have been counseled extensively about nutrition and exercise  Return in about 3 months (around 12/06/2016) for assign PCP and CPE/ f/up anxiety and depression.  The patient was given clear instructions to go to ER or return to medical center if symptoms don't improve, worsen or new problems develop. The patient verbalized understanding. The patient was told to call to get lab results if they haven't heard anything in the next week.     Georgian CoAngela Tait Balistreri, PA-C [facility] [place], [place] [phone]   09/05/2016, 3:51 PM

## 2016-10-10 MED FILL — VENLAFAXINE HCL ER 75 MG CA: 75 | 30 days supply | Qty: 90 | Fill #1

## 2016-10-10 MED FILL — ?BUPROPION HCL SR 150 MG TA: 150 | 30 days supply | Qty: 30 | Fill #1

## 2016-11-18 ENCOUNTER — Other Ambulatory Visit: Payer: Self-pay | Admitting: Physician Assistant

## 2016-11-18 DIAGNOSIS — F329 Major depressive disorder, single episode, unspecified: Secondary | ICD-10-CM

## 2016-11-18 DIAGNOSIS — F32A Depression, unspecified: Secondary | ICD-10-CM

## 2016-11-18 DIAGNOSIS — F419 Anxiety disorder, unspecified: Secondary | ICD-10-CM

## 2016-11-18 MED FILL — BUPROPION SR 150 MG TABLET: 150 | 30 days supply | Qty: 30 | Fill #2

## 2016-11-18 MED FILL — VENLAFAXINE HCL ER 75 MG CA: 75 | 30 days supply | Qty: 90 | Fill #2

## 2016-11-19 ENCOUNTER — Encounter (HOSPITAL_COMMUNITY): Payer: Self-pay | Admitting: Emergency Medicine

## 2016-11-19 ENCOUNTER — Emergency Department (HOSPITAL_COMMUNITY)
Admission: EM | Admit: 2016-11-19 | Discharge: 2016-11-19 | Disposition: A | Discharge status: Home / Self Care | Payer: Self-pay | Attending: Emergency Medicine | Admitting: Emergency Medicine

## 2016-11-19 DIAGNOSIS — Y999 Unspecified external cause status: Secondary | ICD-10-CM | POA: Insufficient documentation

## 2016-11-19 DIAGNOSIS — Z5321 Procedure and treatment not carried out due to patient leaving prior to being seen by health care provider: Secondary | ICD-10-CM | POA: Insufficient documentation

## 2016-11-19 DIAGNOSIS — Y939 Activity, unspecified: Secondary | ICD-10-CM | POA: Insufficient documentation

## 2016-11-19 DIAGNOSIS — Y929 Unspecified place or not applicable: Secondary | ICD-10-CM | POA: Insufficient documentation

## 2016-11-19 DIAGNOSIS — S00501A Unspecified superficial injury of lip, initial encounter: Secondary | ICD-10-CM | POA: Insufficient documentation

## 2016-11-19 DIAGNOSIS — F17210 Nicotine dependence, cigarettes, uncomplicated: Secondary | ICD-10-CM | POA: Insufficient documentation

## 2016-11-19 DIAGNOSIS — S01511A Laceration without foreign body of lip, initial encounter: Secondary | ICD-10-CM | POA: Insufficient documentation

## 2016-11-19 MED ORDER — LIDOCAINE HCL (PF) 1 % IJ SOLN: 5.0000 mL | Freq: Once | INTRAMUSCULAR | Status: DC

## 2016-11-19 MED ORDER — CEPHALEXIN 500 MG PO CAPS
500.0000 mg | ORAL_CAPSULE | Freq: Once | ORAL | Status: AC
  Administered 2016-11-19: 500 mg via ORAL
  Filled 2016-11-19: qty 1

## 2016-11-19 MED ORDER — CEPHALEXIN 500 MG PO CAPS: 500.0000 mg | ORAL_CAPSULE | Freq: Two times a day (BID) | ORAL | 0 refills | Status: DC

## 2016-11-19 MED FILL — CEPHALEXIN 500 MG CAPSULE: 500 | 7 days supply | Qty: 14 | Fill #0
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
## 2016-11-19 NOTE — Discharge Instructions (Signed)
Follow up with your doctor, an urgent care, or this Emergency Department for removal of your stitches in 7 days. Do not submerge the stitches in water for the first 24 hours. Take your pain medication as prescribed. Do not operate heavy machinery while on pain medication.   You may take ibuprofen or tylenol for pain. For the inner lip laceration rinse your mouth with a warm, salt-water rinse 4-6 times per day or as directed by your health care provider. You can make a salt-water rinse by mixing one tsp of salt into two cups of warm water. Maintain regular oral hygiene, if possible. Gently brush your teeth with a soft, nylon-bristled toothbrush 2 times per day. Check your wound every day for signs of infection. It is normal to have a white or gray patch over your wound while it heals. Watch for: Redness. Swelling. Blood or pus.  TREATMENT  Keep the wound clean and dry for the next 24 hours and leave the dressing in place. You may shower after 24 hours. Do not soak the area for long periods of times as in a bath until the sutures are removed. After 24 hours you may remove the dressing and gently clean the laceration site with antibacterial soap and warm water 2 times a day. Pat dry with clean towel. Do not scrub. Once the wound has healed, scarring can be minimized by covering the wound with sunscreen during the day for 1 full year.  SEEK MEDICAL CARE IF:  You have redness, swelling, or increasing pain in the wound.  You see a red line that goes away from the wound.  You have yellowish-white fluid (pus) coming from the wound.  You have a fever.  You notice a bad smell coming from the wound or dressing.  Your wound breaks open before or after sutures have been removed.  You notice something coming out of the wound such as wood or glass.  Your wound is on your hand or foot and you cannot move a finger or toe.  Your pain is not controlled with prescribed medicine.  Your face or the area under your  jaw becomes swollen. You have trouble breathing or swallowing If you did not receive a tetanus shot today because you thought you were up to date, but did not recall when your last one was given, make sure to check with your primary caregiver to determine if you need one.
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
## 2016-11-19 NOTE — ED Notes (Signed)
Called for room 3 times no answer  

## 2016-12-06 ENCOUNTER — Ambulatory Visit: Payer: Self-pay | Admitting: Internal Medicine

## 2016-12-18 ENCOUNTER — Ambulatory Visit: Payer: Self-pay

## 2016-12-18 ENCOUNTER — Telehealth: Payer: Self-pay | Admitting: Family Medicine

## 2016-12-18 NOTE — Telephone Encounter (Signed)
Pt called to request a a refill for all his med. He had a appt today but has to be cancel since he has to work, he schedule to see his new pcp 01/06/17 if you can get a refill until that day, please let him know, please advice

## 2016-12-18 NOTE — Telephone Encounter (Signed)
Pt called to request a a refill for all his med. He had a appt today but has to be cancel since he has to work, he schedule to see his new pcp 01/06/17 if you can get a refill until that day, please let him know

## 2016-12-19 ENCOUNTER — Other Ambulatory Visit: Payer: Self-pay | Admitting: Family Medicine

## 2016-12-19 DIAGNOSIS — F419 Anxiety disorder, unspecified: Secondary | ICD-10-CM

## 2016-12-19 DIAGNOSIS — F32A Depression, unspecified: Secondary | ICD-10-CM

## 2016-12-19 DIAGNOSIS — F329 Major depressive disorder, single episode, unspecified: Secondary | ICD-10-CM

## 2016-12-19 MED ORDER — VENLAFAXINE HCL ER 75 MG PO CP24: ORAL_CAPSULE | ORAL | 0 refills | Status: DC

## 2016-12-19 MED ORDER — BUPROPION HCL ER (SR) 150 MG PO TB12: 150.0000 mg | ORAL_TABLET | Freq: Every day | ORAL | 0 refills | Status: DC

## 2016-12-19 MED FILL — BUPROPION SR 150 MG TABLET: 150 | 30 days supply | Qty: 30 | Fill #0

## 2016-12-19 MED FILL — VENLAFAXINE HCL ER 75 MG CA: 75 | 30 days supply | Qty: 90 | Fill #0

## 2016-12-19 NOTE — Telephone Encounter (Signed)
Refills sent for 30 day supply. Must have office visit for additional refills.

## 2016-12-20 NOTE — Telephone Encounter (Signed)
CMA call regarding medication refill    Patient did not answer & unable to leave message  

## 2017-01-03 ENCOUNTER — Ambulatory Visit (HOSPITAL_COMMUNITY)
Admission: EM | Admit: 2017-01-03 | Discharge: 2017-01-03 | Disposition: A | Discharge status: Home / Self Care | Payer: Self-pay | Attending: Emergency Medicine | Admitting: Emergency Medicine

## 2017-01-03 ENCOUNTER — Encounter (HOSPITAL_COMMUNITY): Payer: Self-pay | Admitting: Family Medicine

## 2017-01-03 DIAGNOSIS — F32A Depression, unspecified: Secondary | ICD-10-CM

## 2017-01-03 DIAGNOSIS — F329 Major depressive disorder, single episode, unspecified: Secondary | ICD-10-CM

## 2017-01-03 NOTE — Discharge Instructions (Signed)
Continue medication as directed. If having any suicidal ideation/homicidal ideation, please to go the emergency department for further evaluation. Follow up as scheduled for further evaluation and treatment of depression and anxiety.

## 2017-01-03 NOTE — ED Triage Notes (Addendum)
Pt here for anxiety and depression. Takes medication for both but recently got some bad news and feels like the medication isnt working. Denies thoughts of suicide. Has an appointment Monday to see his doctor but would like to have a few days off work until he can see the doctor.
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
## 2017-01-06 ENCOUNTER — Ambulatory Visit: Payer: Self-pay | Attending: Family Medicine | Admitting: Family Medicine

## 2017-01-06 ENCOUNTER — Encounter: Payer: Self-pay | Admitting: Family Medicine

## 2017-01-06 VITALS — BP 100/63 | HR 86 | Temp 98.3°F | Resp 18 | Ht 68.0 in | Wt 202.2 lb

## 2017-01-06 DIAGNOSIS — F419 Anxiety disorder, unspecified: Secondary | ICD-10-CM | POA: Insufficient documentation

## 2017-01-06 DIAGNOSIS — F32A Depression, unspecified: Secondary | ICD-10-CM

## 2017-01-06 DIAGNOSIS — H547 Unspecified visual loss: Secondary | ICD-10-CM | POA: Insufficient documentation

## 2017-01-06 DIAGNOSIS — H538 Other visual disturbances: Secondary | ICD-10-CM | POA: Insufficient documentation

## 2017-01-06 DIAGNOSIS — Z0000 Encounter for general adult medical examination without abnormal findings: Secondary | ICD-10-CM | POA: Insufficient documentation

## 2017-01-06 DIAGNOSIS — Z720 Tobacco use: Secondary | ICD-10-CM | POA: Insufficient documentation

## 2017-01-06 DIAGNOSIS — Z79899 Other long term (current) drug therapy: Secondary | ICD-10-CM | POA: Insufficient documentation

## 2017-01-06 DIAGNOSIS — Z8669 Personal history of other diseases of the nervous system and sense organs: Secondary | ICD-10-CM

## 2017-01-06 DIAGNOSIS — F329 Major depressive disorder, single episode, unspecified: Secondary | ICD-10-CM | POA: Insufficient documentation

## 2017-01-06 DIAGNOSIS — F17200 Nicotine dependence, unspecified, uncomplicated: Secondary | ICD-10-CM

## 2017-01-06 MED ORDER — BUPROPION HCL ER (SR) 150 MG PO TB12: 150.0000 mg | ORAL_TABLET | Freq: Two times a day (BID) | ORAL | 3 refills | Status: DC

## 2017-01-06 NOTE — Progress Notes (Signed)
Subjective:  Patient ID: Zachary GoldenKevin L Lindsey, male    DOB: [birth year]  Age: 46 y.o. MRN: [id number]  CC: Annual Exam   HPI Zachary GoldenKevin L Lindsey presents for comprehensive physical examination and anxiety/depression follow up. History of depression and anxiety. Onset was approximately 5 years ago, stable since that time.  Symptoms include excessive worry and anhedonia.  He denies current suicidal and homicidal plan or intent.  Possible organic causes contributing are: none.  Risk factors: negative life event divorce from wife of 23 years, five years ago Previous treatment includes Effexor and Wellbutrin. He reports never trying therapy or counseling. He reports support system includes mother and his pastors. He is agreeable to speaking with LCSW.  He complains of the following side effects from the treatment: none. Patient denies chest pain, chest pressure/discomfort, claudication, dyspnea, lower extremity edema, near-syncope, palpitations and syncope. Denies symptoms of all other pertinent systems.    Outpatient Medications Prior to Visit  Medication Sig Dispense Refill  . venlafaxine XR (EFFEXOR-XR) 75 MG 24 hr capsule TAKE 3 CAPSULES BY MOUTH DAILY WITH BREAKFAST 90 capsule 0  . buPROPion (WELLBUTRIN SR) 150 MG 12 hr tablet Take 1 tablet (150 mg total) by mouth daily. 30 tablet 0  . cephALEXin (KEFLEX) 500 MG capsule Take 1 capsule (500 mg total) by mouth 2 (two) times daily. 14 capsule 0   No facility-administered medications prior to visit.     ROS Review of Systems  Constitutional: Negative.   HENT: Negative.   Eyes: Positive for visual disturbance.  Respiratory: Negative.   Cardiovascular: Negative.   Gastrointestinal: Negative.   Endocrine: Negative.   Genitourinary: Negative.   Musculoskeletal: Negative.   Skin: Negative.   Allergic/Immunologic: Negative.   Neurological: Negative.   Hematological: Negative.   Psychiatric/Behavioral: Positive for dysphoric mood (history of  depression). Negative for suicidal ideas. The patient is nervous/anxious (history of anxiety).     Objective:  BP 100/63 (BP Location: Left Arm, Patient Position: Sitting, Cuff Size: Normal)   Pulse 86   Temp 98.3 F (36.8 C) (Oral)   Resp 18   Ht 5\' 8"  (1.727 m)   Wt 202 lb 3.2 oz (91.7 kg)   SpO2 98%   BMI 30.74 kg/m   BP/Weight 01/06/2017 01/03/2017 11/19/2016  Systolic BP 100 121 145  Diastolic BP 63 81 85  Wt. (Lbs) 202.2 - -  BMI 30.74 - -    Physical Exam  Constitutional: He is oriented to person, place, and time. He appears well-developed and well-nourished.  HENT:  Head: Normocephalic and atraumatic.  Right Ear: External ear normal.  Left Ear: External ear normal.  Nose: Nose normal.  Mouth/Throat: Oropharynx is clear and moist.  Eyes: Pupils are equal, round, and reactive to light. Conjunctivae and EOM are normal.  Neck: Normal range of motion. Neck supple. No JVD present. No thyromegaly present.  Cardiovascular: Normal rate, regular rhythm, normal heart sounds and intact distal pulses.   Pulmonary/Chest: Effort normal and breath sounds normal.  Abdominal: Soft. Bowel sounds are normal. There is no tenderness.  Musculoskeletal: Normal range of motion.  Lymphadenopathy:    He has no cervical adenopathy.  Neurological: He is alert and oriented to person, place, and time. He has normal reflexes. He displays a negative Romberg sign. Gait normal.  Skin: Skin is warm and dry.  Psychiatric: His mood appears anxious. He expresses no homicidal and no suicidal ideation. He expresses no suicidal plans and no homicidal plans.  Nursing note and vitals  reviewed.   Depression screen [facility] 2/9 01/06/2017 09/05/2016  Decreased Interest 3 0  Down, Depressed, Hopeless 2 2  PHQ - 2 Score 5 2  Altered sleeping 3 0  Tired, decreased energy 3 1  Change in appetite 1 1  Feeling bad or failure about yourself  3 2  Trouble concentrating 1 2  Moving slowly or fidgety/restless 0 0    Suicidal thoughts 0 0  PHQ-9 Score 16 8   GAD 7 : Generalized Anxiety Score 01/06/2017 09/05/2016  Nervous, Anxious, on Edge 3 2  Control/stop worrying 2 2  Worry too much - different things 2 2  Trouble relaxing 2 2  Restless 2 2  Easily annoyed or irritable 2 2  Afraid - awful might happen 0 2  Total GAD 7 Score 13 14      Assessment & Plan:   1. Annual physical exam  - CBC with Differential - Lipid Panel - TSH - VITAMIN D 25 Hydroxy (Vit-D Deficiency, Fractures) - PSA - Hemoglobin A1c - CMP and Liver  2. Anxiety Will refer to LCSW for additional follow up. - buPROPion (WELLBUTRIN SR) 150 MG 12 hr tablet; Take 1 tablet (150 mg total) by mouth 2 (two) times daily.  Dispense: 60 tablet; Refill: 3  3. Depression, unspecified depression type Will refer to LCSW for additional follow up. - buPROPion (WELLBUTRIN SR) 150 MG 12 hr tablet; Take 1 tablet (150 mg total) by mouth 2 (two) times daily.  Dispense: 60 tablet; Refill: 3  4. Ready to quit smoking Increased dosage of Wellbutrin for smoking cessation and anxiety/depression. - buPROPion (WELLBUTRIN SR) 150 MG 12 hr tablet; Take 1 tablet (150 mg total) by mouth 2 (two) times daily.  Dispense: 60 tablet; Refill: 3  5. Blurred vision, bilateral Vision acuity screen - Ambulatory referral to Ophthalmology  6. History of visual impairment Vision acuity screen - Ambulatory referral to Ophthalmology   Meds ordered this encounter  Medications  . buPROPion (WELLBUTRIN SR) 150 MG 12 hr tablet    Sig: Take 1 tablet (150 mg total) by mouth 2 (two) times daily.    Dispense:  60 tablet    Refill:  3    Order Specific Question:   Supervising Provider    Answer:   Quentin Angst [[id number]]    Follow-up: Return in about 2 months (around 03/08/2017) for Anxiety/Depressoin.   Lizbeth Bark FNP

## 2017-01-06 NOTE — Patient Instructions (Addendum)
Steps to Quit Smoking Smoking tobacco can be harmful to your health and can affect almost every organ in your body. Smoking puts you, and those around you, at risk for developing many serious chronic diseases. Quitting smoking is difficult, but it is one of the best things that you can do for your health. It is never too late to quit. What are the benefits of quitting smoking? When you quit smoking, you lower your risk of developing serious diseases and conditions, such as:  Lung cancer or lung disease, such as COPD.  Heart disease.  Stroke.  Heart attack.  Infertility.  Osteoporosis and bone fractures.  Additionally, symptoms such as coughing, wheezing, and shortness of breath may get better when you quit. You may also find that you get sick less often because your body is stronger at fighting off colds and infections. If you are pregnant, quitting smoking can help to reduce your chances of having a baby of low birth weight. How do I get ready to quit? When you decide to quit smoking, create a plan to make sure that you are successful. Before you quit:  Pick a date to quit. Set a date within the next two weeks to give you time to prepare.  Write down the reasons why you are quitting. Keep this list in places where you will see it often, such as on your bathroom mirror or in your car or wallet.  Identify the people, places, things, and activities that make you want to smoke (triggers) and avoid them. Make sure to take these actions: ? Throw away all cigarettes at home, at work, and in your car. ? Throw away smoking accessories, such as ashtrays and lighters. ? Clean your car and make sure to empty the ashtray. ? Clean your home, including curtains and carpets.  Tell your family, friends, and coworkers that you are quitting. Support from your loved ones can make quitting easier.  Talk with your health care provider about your options for quitting smoking.  Find out what treatment  options are covered by your health insurance.  What strategies can I use to quit smoking? Talk with your healthcare provider about different strategies to quit smoking. Some strategies include:  Quitting smoking altogether instead of gradually lessening how much you smoke over a period of time. Research shows that quitting "cold turkey" is more successful than gradually quitting.  Attending in-person counseling to help you build problem-solving skills. You are more likely to have success in quitting if you attend several counseling sessions. Even short sessions of 10 minutes can be effective.  Finding resources and support systems that can help you to quit smoking and remain smoke-free after you quit. These resources are most helpful when you use them often. They can include: ? Online chats with a counselor. ? Telephone quitlines. ? Printed self-help materials. ? Support groups or group counseling. ? Text messaging programs. ? Mobile phone applications.  Taking medicines to help you quit smoking. (If you are pregnant or breastfeeding, talk with your health care provider first.) Some medicines contain nicotine and some do not. Both types of medicines help with cravings, but the medicines that include nicotine help to relieve withdrawal symptoms. Your health care provider may recommend: ? Nicotine patches, gum, or lozenges. ? Nicotine inhalers or sprays. ? Non-nicotine medicine that is taken by mouth.  Talk with your health care provider about combining strategies, such as taking medicines while you are also receiving in-person counseling. Using these two strategies together   makes you more likely to succeed in quitting than if you used either strategy on its own. If you are pregnant or breastfeeding, talk with your health care provider about finding counseling or other support strategies to quit smoking. Do not take medicine to help you quit smoking unless told to do so by your health care  provider. What things can I do to make it easier to quit? Quitting smoking might feel overwhelming at first, but there is a lot that you can do to make it easier. Take these important actions:  Reach out to your family and friends and ask that they support and encourage you during this time. Call telephone quitlines, reach out to support groups, or work with a counselor for support.  Ask people who smoke to avoid smoking around you.  Avoid places that trigger you to smoke, such as bars, parties, or smoke-break areas at work.  Spend time around people who do not smoke.  Lessen stress in your life, because stress can be a smoking trigger for some people. To lessen stress, try: ? Exercising regularly. ? Deep-breathing exercises. ? Yoga. ? Meditating. ? Performing a body scan. This involves closing your eyes, scanning your body from head to toe, and noticing which parts of your body are particularly tense. Purposefully relax the muscles in those areas.  Download or purchase mobile phone or tablet apps (applications) that can help you stick to your quit plan by providing reminders, tips, and encouragement. There are many free apps, such as QuitGuide from the CDC (Centers for Disease Control and Prevention). You can find other support for quitting smoking (smoking cessation) through smokefree.gov and other websites.  How will I feel when I quit smoking? Within the first 24 hours of quitting smoking, you may start to feel some withdrawal symptoms. These symptoms are usually most noticeable 2-3 days after quitting, but they usually do not last beyond 2-3 weeks. Changes or symptoms that you might experience include:  Mood swings.  Restlessness, anxiety, or irritation.  Difficulty concentrating.  Dizziness.  Strong cravings for sugary foods in addition to nicotine.  Mild weight gain.  Constipation.  Nausea.  Coughing or a sore throat.  Changes in how your medicines work in your  body.  A depressed mood.  Difficulty sleeping (insomnia).  After the first 2-3 weeks of quitting, you may start to notice more positive results, such as:  Improved sense of smell and taste.  Decreased coughing and sore throat.  Slower heart rate.  Lower blood pressure.  Clearer skin.  The ability to breathe more easily.  Fewer sick days.  Quitting smoking is very challenging for most people. Do not get discouraged if you are not successful the first time. Some people need to make many attempts to quit before they achieve long-term success. Do your best to stick to your quit plan, and talk with your health care provider if you have any questions or concerns. This information is not intended to replace advice given to you by your health care provider. Make sure you discuss any questions you have with your health care provider. Document Released: 04/09/2001 Document Revised: 12/12/2015 Document Reviewed: 08/30/2014 Elsevier Interactive Patient Education  2017 Elsevier Inc.  

## 2017-01-06 NOTE — Progress Notes (Signed)
Patient is here for f/up  

## 2017-01-07 ENCOUNTER — Telehealth: Payer: Self-pay

## 2017-01-07 LAB — TSH: TSH: 1.94 u[IU]/mL (ref 0.450–4.500)

## 2017-01-07 LAB — CBC WITH DIFFERENTIAL/PLATELET
BASOS ABS: 0 10*3/uL (ref 0.0–0.2)
BASOS: 1 %
EOS (ABSOLUTE): 0.3 10*3/uL (ref 0.0–0.4)
Eos: 4 %
HEMOGLOBIN: 14 g/dL (ref 13.0–17.7)
Hematocrit: 41.8 % (ref 37.5–51.0)
Immature Grans (Abs): 0 10*3/uL (ref 0.0–0.1)
Immature Granulocytes: 0 %
LYMPHS ABS: 2.8 10*3/uL (ref 0.7–3.1)
Lymphs: 35 %
MCH: 30.4 pg (ref 26.6–33.0)
MCHC: 33.5 g/dL (ref 31.5–35.7)
MCV: 91 fL (ref 79–97)
Monocytes Absolute: 0.6 10*3/uL (ref 0.1–0.9)
Monocytes: 7 %
NEUTROS ABS: 4.3 10*3/uL (ref 1.4–7.0)
Neutrophils: 53 %
PLATELETS: 220 10*3/uL (ref 150–379)
RBC: 4.61 x10E6/uL (ref 4.14–5.80)
RDW: 14.8 % (ref 12.3–15.4)
WBC: 8 10*3/uL (ref 3.4–10.8)

## 2017-01-07 LAB — CMP AND LIVER
ALBUMIN: 4.4 g/dL (ref 3.5–5.5)
ALT: 32 IU/L (ref 0–44)
AST: 43 IU/L — ABNORMAL HIGH (ref 0–40)
Alkaline Phosphatase: 98 IU/L (ref 39–117)
BILIRUBIN TOTAL: 0.4 mg/dL (ref 0.0–1.2)
BILIRUBIN, DIRECT: 0.12 mg/dL (ref 0.00–0.40)
BUN: 15 mg/dL (ref 6–24)
CALCIUM: 9.5 mg/dL (ref 8.7–10.2)
CHLORIDE: 103 mmol/L (ref 96–106)
CO2: 26 mmol/L (ref 20–29)
Creatinine, Ser: 1.59 mg/dL — ABNORMAL HIGH (ref 0.76–1.27)
GFR calc Af Amer: 60 mL/min/{1.73_m2} (ref >59)
GFR calc non Af Amer: 52 mL/min/{1.73_m2} — ABNORMAL LOW (ref >59)
Glucose: 89 mg/dL (ref 65–99)
POTASSIUM: 4 mmol/L (ref 3.5–5.2)
Sodium: 143 mmol/L (ref 134–144)
Total Protein: 7.4 g/dL (ref 6.0–8.5)

## 2017-01-07 LAB — VITAMIN D 25 HYDROXY (VIT D DEFICIENCY, FRACTURES): VIT D 25 HYDROXY: 38.2 ng/mL (ref 30.0–100.0)

## 2017-01-07 LAB — LIPID PANEL
CHOLESTEROL TOTAL: 190 mg/dL (ref 100–199)
Chol/HDL Ratio: 3.1 ratio (ref 0.0–5.0)
HDL: 61 mg/dL (ref >39)
LDL CALC: 107 mg/dL — AB (ref 0–99)
Triglycerides: 108 mg/dL (ref 0–149)
VLDL CHOLESTEROL CAL: 22 mg/dL (ref 5–40)

## 2017-01-07 LAB — HEMOGLOBIN A1C
Est. average glucose Bld gHb Est-mCnc: 120 mg/dL
Hgb A1c MFr Bld: 5.8 % — ABNORMAL HIGH (ref 4.8–5.6)

## 2017-01-07 LAB — PSA: PROSTATE SPECIFIC AG, SERUM: 0.7 ng/mL (ref 0.0–4.0)

## 2017-01-07 NOTE — Telephone Encounter (Signed)
-----   Message from Lizbeth BarkMandesia R Hairston, FNP sent at 01/07/2017  9:03 AM EDT ----- Labs that evaluated your blood cells, fluid and electrolyte balance are normal. No signs of anemia, acute infection, or inflammation present. Thyroid function normal Liver function normal Kidney function is decreased. Avoid taking NSAID medications, reduce salt intake to 2 to 4 grams/day, do not smoke. Recommend monitoring again in 6 months. If your levels have signifcantly increased you will be referred to nephrology. Vitamin D level was low. Vitamin D helps to keep bones strong. You were prescribed ergocalciferol (capsules) to increase your vitamin-d level.  PSA is normal. PSA is screens for prostate problems Diabetes screening indicates you are pre-diabetic.Start eating a carbohydrate modified diet. Avoid eating large amounts of starches, white bread, rice, and sugar.Increase activity. Recommend follow up in 3 months. Lipid levels were elevated. This can increase your risk of heart disease overtime. Start eating a diet low in saturated fat. Limit your intake of fried foods, red meats, and whole milk.

## 2017-01-07 NOTE — Telephone Encounter (Signed)
CMA call regarding lab results   Patient did not answer but left a VM stating the reason of the call &  to call me back  

## 2017-01-30 ENCOUNTER — Telehealth: Payer: Self-pay | Admitting: Family Medicine

## 2017-01-30 ENCOUNTER — Other Ambulatory Visit: Payer: Self-pay

## 2017-01-30 DIAGNOSIS — F419 Anxiety disorder, unspecified: Secondary | ICD-10-CM

## 2017-01-30 DIAGNOSIS — F32A Depression, unspecified: Secondary | ICD-10-CM

## 2017-01-30 DIAGNOSIS — F329 Major depressive disorder, single episode, unspecified: Secondary | ICD-10-CM

## 2017-01-30 DIAGNOSIS — F17200 Nicotine dependence, unspecified, uncomplicated: Secondary | ICD-10-CM

## 2017-01-30 MED ORDER — VENLAFAXINE HCL ER 75 MG PO CP24: ORAL_CAPSULE | ORAL | 0 refills | Status: DC

## 2017-01-30 MED ORDER — BUPROPION HCL ER (SR) 150 MG PO TB12: 150.0000 mg | ORAL_TABLET | Freq: Two times a day (BID) | ORAL | 3 refills | Status: DC

## 2017-01-30 MED FILL — VENLAFAXINE HCL ER 75 MG CA: 75 | 30 days supply | Qty: 90 | Fill #0

## 2017-01-30 MED FILL — BUPROPION SR 150 MG TABLET: 150 | 30 days supply | Qty: 60 | Fill #0

## 2017-01-30 NOTE — Telephone Encounter (Signed)
Patient called saying that he was seen on the September 10th and he called the phramcy and they do not have any of his prescriptions. He would like to know when they would be ready.

## 2017-01-30 NOTE — Progress Notes (Unsigned)
Medication refill

## 2017-01-31 NOTE — Telephone Encounter (Signed)
Patient is aware 

## 2017-01-31 NOTE — Telephone Encounter (Signed)
Prescriptions were sent on that day. Prescriptions were resent please notify patient.

## 2017-03-05 MED FILL — VENLAFAXINE HCL ER 75 MG CA: 75 | 30 days supply | Qty: 90 | Fill #0

## 2017-03-05 MED FILL — BUPROPION SR 150 MG TABLET: 150 | 30 days supply | Qty: 60 | Fill #1

## 2017-03-10 ENCOUNTER — Encounter: Payer: Self-pay | Admitting: Family Medicine

## 2017-03-10 ENCOUNTER — Ambulatory Visit: Payer: Self-pay | Attending: Family Medicine | Admitting: Family Medicine

## 2017-03-10 VITALS — BP 122/77 | HR 93 | Temp 98.8°F | Resp 18 | Ht 68.0 in | Wt 207.8 lb

## 2017-03-10 DIAGNOSIS — F419 Anxiety disorder, unspecified: Secondary | ICD-10-CM | POA: Insufficient documentation

## 2017-03-10 DIAGNOSIS — F32A Depression, unspecified: Secondary | ICD-10-CM

## 2017-03-10 DIAGNOSIS — F329 Major depressive disorder, single episode, unspecified: Secondary | ICD-10-CM | POA: Insufficient documentation

## 2017-03-10 DIAGNOSIS — Z79899 Other long term (current) drug therapy: Secondary | ICD-10-CM | POA: Insufficient documentation

## 2017-03-10 DIAGNOSIS — G4700 Insomnia, unspecified: Secondary | ICD-10-CM | POA: Insufficient documentation

## 2017-03-10 DIAGNOSIS — Z09 Encounter for follow-up examination after completed treatment for conditions other than malignant neoplasm: Secondary | ICD-10-CM

## 2017-03-10 MED ORDER — TRAZODONE HCL 50 MG PO TABS: 25.0000 mg | ORAL_TABLET | Freq: Every evening | ORAL | 0 refills | Status: DC | PRN

## 2017-03-10 MED ORDER — BUPROPION HCL ER (SR) 150 MG PO TB12: 150.0000 mg | ORAL_TABLET | Freq: Two times a day (BID) | ORAL | 3 refills | Status: DC

## 2017-03-10 MED ORDER — VENLAFAXINE HCL ER 75 MG PO CP24: ORAL_CAPSULE | ORAL | 3 refills | Status: DC

## 2017-03-10 NOTE — Patient Instructions (Signed)

## 2017-03-10 NOTE — Progress Notes (Deleted)
   Subjective:  Patient ID: Zachary GoldenKevin L Bounds, male    DOB: February 22, 1971  Age: 46 y.o. MRN: [id number]  CC: Follow-up   HPI Zachary Lindsey presents for      Outpatient Medications Prior to Visit  Medication Sig Dispense Refill  . buPROPion (WELLBUTRIN SR) 150 MG 12 hr tablet Take 1 tablet (150 mg total) by mouth 2 (two) times daily. 60 tablet 3  . venlafaxine XR (EFFEXOR-XR) 75 MG 24 hr capsule TAKE 3 CAPSULES BY MOUTH DAILY WITH BREAKFAST 90 capsule 0   No facility-administered medications prior to visit.     ROS Review of Systems     Objective:  BP 122/77 (BP Location: Left Arm, Patient Position: Sitting, Cuff Size: Normal)   Pulse 93   Temp 98.8 F (37.1 C) (Oral)   Resp 18   Ht 5\' 8"  (1.727 m)   Wt 207 lb 12.8 oz (94.3 kg)   SpO2 98%   BMI 31.60 kg/m   BP/Weight 03/10/2017 01/06/2017 01/03/2017  Systolic BP 122 100 121  Diastolic BP 77 63 81  Wt. (Lbs) 207.8 202.2 -  BMI 31.6 30.74 -     Physical Exam   Assessment & Plan:     No orders of the defined types were placed in this encounter.   Follow-up: No Follow-up on file.   Lizbeth BarkMandesia R Linzie Criss FNP

## 2017-03-10 NOTE — Progress Notes (Signed)
Patient is here for f/up  

## 2017-03-10 NOTE — Progress Notes (Signed)
Subjective:  Patient ID: Zachary GoldenKevin L Rebel, male    DOB: [birth year]  Age: 46 y.o. MRN: [id number]  CC: Follow-up   HPI Zachary Lindsey presents for anxiety/depression follow up. History of depression and anxiety. Onset was approximately 5 years ago, stable since that time.  Symptoms include excessive worry and anhedonia.  He denies current suicidal and homicidal plan or intent.  Possible organic causes contributing are: none.  Risk factors: negative life event divorce from wife of 23 years, five years ago He reports not trying therapy or counseling yet. He reports support system includes mother and his pastors. He declines speaking with LCSW today but would like resource information. Previous treatment includes Effexor and Wellbutrin.  He complains of the following side effects from the treatment: none. Patient denies chest pain, chest pressure/discomfort, claudication, dyspnea, lower extremity edema, near-syncope, palpitations and syncope. Denies symptoms of all other pertinent systems.    Outpatient Medications Prior to Visit  Medication Sig Dispense Refill  . buPROPion (WELLBUTRIN SR) 150 MG 12 hr tablet Take 1 tablet (150 mg total) by mouth 2 (two) times daily. 60 tablet 3  . venlafaxine XR (EFFEXOR-XR) 75 MG 24 hr capsule TAKE 3 CAPSULES BY MOUTH DAILY WITH BREAKFAST 90 capsule 0   No facility-administered medications prior to visit.     ROS Review of Systems  Constitutional: Negative.   Respiratory: Negative.   Cardiovascular: Negative.   Psychiatric/Behavioral: Negative for suicidal ideas. Dysphoric mood: history of depression. Nervous/anxious: history of anxiety.     Objective:  BP 122/77 (BP Location: Left Arm, Patient Position: Sitting, Cuff Size: Normal)   Pulse 93   Temp 98.8 F (37.1 C) (Oral)   Resp 18   Ht 5\' 8"  (1.727 m)   Wt 207 lb 12.8 oz (94.3 kg)   SpO2 98%   BMI 31.60 kg/m   BP/Weight 03/10/2017 01/06/2017 01/03/2017  Systolic BP 122 100 121  Diastolic BP 77 63  81  Wt. (Lbs) 207.8 202.2 -  BMI 31.6 30.74 -    Physical Exam  Constitutional: He appears well-developed and well-nourished.  Cardiovascular: Normal rate, regular rhythm, normal heart sounds and intact distal pulses.  Pulmonary/Chest: Effort normal and breath sounds normal.  Neurological: He displays a negative Romberg sign. Gait normal.  Psychiatric: He is slowed. He expresses no homicidal and no suicidal ideation. He expresses no suicidal plans and no homicidal plans.  Nursing note and vitals reviewed.   [facility] screen [facility] 03/10/2017 01/06/2017 09/05/2016  Decreased Interest 2 3 0  Down, Depressed, Hopeless 3 2 2   PHQ - 2 Score 5 5 2   Altered sleeping 0 3 0  Tired, decreased energy 2 3 1   Change in appetite 1 1 1   Feeling bad or failure about yourself  2 3 2   Trouble concentrating 2 1 2   Moving slowly or fidgety/restless 2 0 0  Suicidal thoughts 0 0 0  PHQ-9 Score 14 16 8    GAD 7 : Generalized Anxiety Score 03/10/2017 01/06/2017 09/05/2016  Nervous, Anxious, on Edge 3 3 2   Control/stop worrying 3 2 2   Worry too much - different things 3 2 2   Trouble relaxing 3 2 2   Restless 3 2 2   Easily annoyed or irritable 3 2 2   Afraid - awful might happen 2 0 2  Total GAD 7 Score 20 13 14       Assessment & Plan:   1. Follow up   2. Anxiety Options for management discussed with patient, he  would like to begin outpatient therapy. Resources given for counseling services. - buPROPion (WELLBUTRIN SR) 150 MG 12 hr tablet; Take 1 tablet (150 mg total) 2 (two) times daily by mouth.  Dispense: 60 tablet; Refill: 3 - venlafaxine XR (EFFEXOR-XR) 75 MG 24 hr capsule; TAKE 3 CAPSULES BY MOUTH DAILY WITH BREAKFAST  Dispense: 90 capsule; Refill: 3  3. Depression, unspecified depression type  - buPROPion (WELLBUTRIN SR) 150 MG 12 hr tablet; Take 1 tablet (150 mg total) 2 (two) times daily by mouth.  Dispense: 60 tablet; Refill: 3 - venlafaxine XR (EFFEXOR-XR) 75 MG 24 hr capsule; TAKE  3 CAPSULES BY MOUTH DAILY WITH BREAKFAST  Dispense: 90 capsule; Refill: 3  4. Insomnia, unspecified type  - traZODone (DESYREL) 50 MG tablet; Take 0.5-1 tablets (25-50 mg total) at bedtime as needed by mouth for sleep.  Dispense: 30 tablet; Refill: 0      Follow-up: Return in about 3 months (around 06/10/2017), or if symptoms worsen or fail to improve, for Anxiety/Depression.   Lizbeth BarkMandesia R Illiana Losurdo FNP

## 2017-03-17 ENCOUNTER — Ambulatory Visit: Payer: Self-pay | Attending: Family Medicine

## 2017-03-26 ENCOUNTER — Ambulatory Visit: Payer: Self-pay

## 2017-04-18 MED FILL — VENLAFAXINE HCL ER 75 MG CA: 75 | 30 days supply | Qty: 90 | Fill #0

## 2017-05-21 MED FILL — BUPROPION SR 150 MG TABLET: 150 | 30 days supply | Qty: 60 | Fill #2

## 2017-05-21 MED FILL — VENLAFAXINE HCL ER 75 MG CA: 75 | 30 days supply | Qty: 90 | Fill #1

## 2017-06-16 ENCOUNTER — Ambulatory Visit: Payer: Self-pay | Admitting: Family Medicine

## 2017-06-24 MED FILL — VENLAFAXINE HCL ER 75 MG CA: 75 | 30 days supply | Qty: 90 | Fill #2

## 2017-07-29 MED FILL — VENLAFAXINE HCL ER 75 MG CA: 75 | 30 days supply | Qty: 90 | Fill #3

## 2017-07-29 MED FILL — BUPROPION SR 150 MG TABLET: 150 | 30 days supply | Qty: 60 | Fill #3

## 2017-09-08 ENCOUNTER — Other Ambulatory Visit: Payer: Self-pay | Admitting: *Deleted

## 2017-09-08 DIAGNOSIS — F419 Anxiety disorder, unspecified: Secondary | ICD-10-CM

## 2017-09-08 DIAGNOSIS — F329 Major depressive disorder, single episode, unspecified: Secondary | ICD-10-CM

## 2017-09-08 DIAGNOSIS — F32A Depression, unspecified: Secondary | ICD-10-CM

## 2017-09-08 MED ORDER — VENLAFAXINE HCL ER 75 MG PO CP24: ORAL_CAPSULE | ORAL | 0 refills | Status: DC

## 2017-09-08 MED FILL — VENLAFAXINE HCL ER 75 MG CA: 75 | 10 days supply | Qty: 30 | Fill #0

## 2017-09-19 ENCOUNTER — Ambulatory Visit: Payer: 59 | Attending: Family Medicine | Admitting: Family Medicine

## 2017-09-19 VITALS — BP 115/76 | HR 77 | Temp 98.5°F | Resp 16 | Ht 68.0 in | Wt 214.6 lb

## 2017-09-19 DIAGNOSIS — F17200 Nicotine dependence, unspecified, uncomplicated: Secondary | ICD-10-CM | POA: Insufficient documentation

## 2017-09-19 DIAGNOSIS — Z79899 Other long term (current) drug therapy: Secondary | ICD-10-CM | POA: Diagnosis not present

## 2017-09-19 DIAGNOSIS — F411 Generalized anxiety disorder: Secondary | ICD-10-CM | POA: Diagnosis not present

## 2017-09-19 DIAGNOSIS — F419 Anxiety disorder, unspecified: Secondary | ICD-10-CM | POA: Diagnosis present

## 2017-09-19 MED ORDER — VENLAFAXINE HCL ER 150 MG PO CP24: 300.0000 mg | ORAL_CAPSULE | Freq: Every day | ORAL | 1 refills | Status: DC

## 2017-09-19 MED ORDER — BUSPIRONE HCL 10 MG PO TABS: 10.0000 mg | ORAL_TABLET | Freq: Three times a day (TID) | ORAL | 1 refills | Status: DC

## 2017-09-19 MED ORDER — BUPROPION HCL ER (XL) 300 MG PO TB24: 300.0000 mg | ORAL_TABLET | Freq: Every day | ORAL | 1 refills | Status: DC

## 2017-09-19 MED FILL — busPIRone HCL 10 MG TABS: 10 | 30 days supply | Qty: 90 | Fill #0

## 2017-09-19 MED FILL — BUPROPION HCL XL 300 MG TAB: 300 | 30 days supply | Qty: 30 | Fill #0

## 2017-09-19 MED FILL — VENLAFAXINE HCL ER 150 MG C: 150 | 30 days supply | Qty: 60 | Fill #0

## 2017-09-19 NOTE — Progress Notes (Signed)
Patient ID: Zachary Lindsey, male    DOB: 1971/04/06, 47 y.o.   MRN: [id number]  PCP: Lizbeth Bark, FNP  Chief Complaint  Patient presents with  . Anxiety  . Medication Refill    Subjective:  HPI Zachary Lindsey is a 47 y.o. male presents for evaluation of generalized anxiety disorder and medication refill. 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 He current is prescribed Venlafaxine and Wellbutrin for anxiety related symptoms. Currently he reports symptoms are not at goal although are improved since starting medication back in November. Anxiety symptoms mostly attributed to work and life events. He was prescribed Trazodone at the time for insomnia, although never started medication.  At present, he is not experiencing problems with sleep. Denies suicidal ideation, auditory hallucinations, or thoughts of harming others. Currently not receiving counseling, although is open to therapy.  Social History   Socioeconomic History  . Marital status: Married    Spouse name: Not on file  . Number of children: Not on file  . Years of education: Not on file  . Highest education level: Not on file  Occupational History  . Not on file  Social Needs  . Financial resource strain: Not on file  . Food insecurity:    Worry: Not on file    Inability: Not on file  . Transportation needs:    Medical: Not on file    Non-medical: Not on file  Tobacco Use  . Smoking status: Current Every Day Smoker  . Smokeless tobacco: Never Used  Substance and Sexual Activity  . Alcohol use: No  . Drug use: No  . Sexual activity: Not on file  Lifestyle  . Physical activity:    Days per week: Not on file    Minutes per session: Not on file  . Stress: Not on file  Relationships  . Social connections:    Talks on phone: Not on file    Gets together: Not on file    Attends religious service: Not on file    Active member of club or organization: Not on file    Attends meetings of  clubs or organizations: Not on file    Relationship status: Not on file  . Intimate partner violence:    Fear of current or ex partner: Not on file    Emotionally abused: Not on file    Physically abused: Not on file    Forced sexual activity: Not on file  Other Topics Concern  . Not on file  Social History Narrative  . Not on file    Family History  Problem Relation Age of Onset  . Cancer Paternal Uncle   . Cancer Paternal Grandfather   . Hypertension Paternal Grandfather   . Diabetes Paternal Grandfather      Review of Systems  Pertinent negatives listed in HPI There are no active problems to display for this patient.   No Known Allergies  Prior to Admission medications   Medication Sig Start Date End Date Taking? Authorizing Provider  buPROPion (WELLBUTRIN SR) 150 MG 12 hr tablet Take 1 tablet (150 mg total) 2 (two) times daily by mouth. 03/10/17  Yes Hairston, Mandesia R, FNP  venlafaxine XR (EFFEXOR-XR) 75 MG 24 hr capsule TAKE 3 CAPSULES BY MOUTH DAILY WITH BREAKFAST 09/08/17  Yes Newlin, Enobong, MD  traZODone (DESYREL) 50 MG tablet Take 0.5-1 tablets (25-50 mg total) at bedtime as needed by mouth for sleep. Patient not taking: Reported on  09/19/2017 03/10/17   Lizbeth Bark, FNP    Past Medical, Surgical Family and Social History reviewed and updated.    Objective:   Today's Vitals   09/19/17 0906  BP: 115/76  Pulse: 77  Resp: 16  Temp: 98.5 F (36.9 C)  TempSrc: Oral  SpO2: 95%  Weight: 214 lb 9.6 oz (97.3 kg)  Height:  (1.727 m)    Wt Readings from Last 3 Encounters:  09/19/17 214 lb 9.6 oz (97.3 kg)  03/10/17 207 lb 12.8 oz (94.3 kg)  01/06/17 202 lb 3.2 oz (91.7 kg)    Physical Exam  Constitutional: He is oriented to person, place, and time and well-developed, well-nourished, and in no distress. No distress.  HENT:  Head: Normocephalic.  Eyes: Pupils are equal, round, and reactive to light. Conjunctivae and EOM are normal.  Neck:  Normal range of motion. Neck supple.  Cardiovascular: Normal rate, regular rhythm and normal heart sounds.  Pulmonary/Chest: Effort normal and breath sounds normal.  Neurological: He is alert and oriented to person, place, and time. Gait normal. GCS score is 15.  Skin: Skin is warm and dry. He is not diaphoretic.  Psychiatric: Mood, memory and judgment normal. He has a flat affect.   Physical Exam  Constitutional: He is oriented to person, place, and time and well-developed, well-nourished, and in no distress. No distress.  HENT:  Head: Normocephalic.  Eyes: Pupils are equal, round, and reactive to light. Conjunctivae and EOM are normal.  Neck: Normal range of motion. Neck supple.  Cardiovascular: Normal rate, regular rhythm and normal heart sounds.  Pulmonary/Chest: Effort normal and breath sounds normal.  Neurological: He is alert and oriented to person, place, and time. Gait normal. GCS score is 15.  Skin: Skin is warm and dry. He is not diaphoretic.  Psychiatric: Mood, memory and judgment normal. He has a flat affect.         Assessment & Plan:  1. Generalized anxiety disorder, GAD score improved from prior only slightly 19 today, previously 20. medication changes as follows:  Start Buspar 10 mg TID for anxiety. Increased Effexor 300 mg once daily. Continue Wellbutrin 300 mg ER daily,  Information provided regarding counseling services available.  RTC: 3 months for GAD follow-up and medication evaluation.     Godfrey Pick. Tiburcio Pea, MSN, [facility]  [address] [place], [place], [place] [address] [phone]

## 2017-09-19 NOTE — Progress Notes (Signed)
Pt. Is here for anxiety.  Pt. Stated he need medication refill for Wellbutrin and Venlafaxine.

## 2017-09-19 NOTE — Patient Instructions (Signed)
If you become interested in [employer], please contact [facility] at [phone].  I am starting you on Buspar 10 mg, three times daily for anxiety.  Increased you Venlafaxine 300 mg once daily at breakfast. Continue Wellbutrin 300 mg, however changing to once daily extended release.  Return in 3 months for anxiety follow-up.

## 2017-10-27 MED FILL — VENLAFAXINE HCL ER 150 MG C: 150 | 30 days supply | Qty: 60 | Fill #1

## 2017-10-27 MED FILL — BUPROPION HCL XL 300 MG TAB: 300 | 30 days supply | Qty: 30 | Fill #1

## 2017-12-01 MED FILL — BUPROPION HCL XL 300 MG TAB: 300 | 30 days supply | Qty: 30 | Fill #2

## 2017-12-01 MED FILL — VENLAFAXINE HCL ER 150 MG C: 150 | 15 days supply | Qty: 30 | Fill #2

## 2017-12-19 ENCOUNTER — Ambulatory Visit: Payer: 59 | Admitting: Internal Medicine

## 2017-12-22 MED FILL — VENLAFAXINE HCL ER 150 MG C: 150 | 15 days supply | Qty: 30 | Fill #3

## 2018-01-05 MED FILL — BUPROPION HCL XL 300 MG TAB: 300 | 30 days supply | Qty: 30 | Fill #3

## 2018-01-05 MED FILL — VENLAFAXINE HCL ER 150 MG C: 150 | 15 days supply | Qty: 30 | Fill #4

## 2018-01-27 MED FILL — VENLAFAXINE HCL ER 150 MG C: 150 | 15 days supply | Qty: 30 | Fill #5

## 2018-02-16 MED FILL — BUPROPION HCL XL 300 MG TAB: 300 | 30 days supply | Qty: 30 | Fill #4

## 2018-02-16 MED FILL — VENLAFAXINE HCL ER 150 MG C: 150 | 30 days supply | Qty: 60 | Fill #6

## 2018-03-24 MED FILL — VENLAFAXINE HCL ER 150 MG C: 150 | 30 days supply | Qty: 60 | Fill #7

## 2018-03-24 MED FILL — BUPROPION HCL XL 300 MG TAB: 300 | 30 days supply | Qty: 30 | Fill #5

## 2018-05-08 ENCOUNTER — Other Ambulatory Visit: Payer: Self-pay | Admitting: *Deleted

## 2018-05-08 MED ORDER — VENLAFAXINE HCL ER 150 MG PO CP24: 300.0000 mg | ORAL_CAPSULE | Freq: Every day | ORAL | 0 refills | Status: DC

## 2018-05-08 MED ORDER — BUPROPION HCL ER (XL) 300 MG PO TB24: 300.0000 mg | ORAL_TABLET | Freq: Every day | ORAL | 0 refills | Status: DC

## 2018-05-08 MED FILL — VENLAFAXINE HCL ER 150 MG C: 150 | 30 days supply | Qty: 60 | Fill #0

## 2018-05-08 MED FILL — BUPROPION HCL XL 300 MG TAB: 300 | 30 days supply | Qty: 30 | Fill #0

## 2018-06-17 MED FILL — VENLAFAXINE HCL ER 150 MG C: 150 | 15 days supply | Qty: 30 | Fill #1

## 2018-06-17 MED FILL — BUPROPION HCL XL 300 MG TAB: 300 | 30 days supply | Qty: 30 | Fill #1

## 2018-06-19 ENCOUNTER — Ambulatory Visit: Payer: 59 | Admitting: Internal Medicine

## 2018-06-19 ENCOUNTER — Ambulatory Visit: Payer: 59 | Attending: Family Medicine | Admitting: Family Medicine

## 2018-06-19 ENCOUNTER — Encounter: Payer: Self-pay | Admitting: Family Medicine

## 2018-06-19 ENCOUNTER — Ambulatory Visit: Payer: Self-pay | Admitting: Internal Medicine

## 2018-06-19 VITALS — BP 117/72 | HR 76 | Temp 98.2°F | Resp 18 | Ht 68.0 in | Wt 223.0 lb

## 2018-06-19 DIAGNOSIS — E66811 Obesity, class 1: Secondary | ICD-10-CM

## 2018-06-19 DIAGNOSIS — Z6833 Body mass index (BMI) 33.0-33.9, adult: Secondary | ICD-10-CM

## 2018-06-19 DIAGNOSIS — E669 Obesity, unspecified: Secondary | ICD-10-CM

## 2018-06-19 DIAGNOSIS — Z79899 Other long term (current) drug therapy: Secondary | ICD-10-CM

## 2018-06-19 DIAGNOSIS — F411 Generalized anxiety disorder: Secondary | ICD-10-CM | POA: Insufficient documentation

## 2018-06-19 DIAGNOSIS — Z720 Tobacco use: Secondary | ICD-10-CM | POA: Insufficient documentation

## 2018-06-19 DIAGNOSIS — G479 Sleep disorder, unspecified: Secondary | ICD-10-CM

## 2018-06-19 MED ORDER — BUPROPION HCL ER (XL) 300 MG PO TB24: 300.0000 mg | ORAL_TABLET | Freq: Every day | ORAL | 1 refills | Status: AC

## 2018-06-19 MED ORDER — VENLAFAXINE HCL ER 150 MG PO CP24: 300.0000 mg | ORAL_CAPSULE | Freq: Every day | ORAL | 1 refills | Status: AC

## 2018-06-19 NOTE — Progress Notes (Signed)
Subjective:    Patient ID: Zachary Lindsey, male    DOB: [birth year], 48 y.o.   MRN: [id number]  HPI       48 yo male new to me as a patient who was seen as a same day work-in. Patient per chart was most recently seen on 09/19/17 by another provider secondary to Generalized Anxiety Disorder (GAD) for which has had been taking Venlafaxine and Wellbutrin. Buspar 10 mg three times daily was added to his medication regimen at his visit in May of 2019.  At today's visit, patient reports that he is currently only taking venlafaxine and Wellbutrin.  Patient feels that his anxiety is well controlled. (Patient had not completed his PHQ 9 at the time of exam).  Patient reports that he is having no issues with the medication but does recall 1 episode of onset of extreme dizziness when he had gotten up at night to urinate. Patient is not sure if he was slightly dehydrated as he had two beers prior to bedtime which is not normal for him. After urinating he sat down and then drank a glass of water and dizziness resolved within about 5 minutes.  Upon questioning, patient also believes he had missed a few days of his medications prior to this episode of dizziness.  Patient has had no further episodes of dizziness and patient denies any regular nocturia.       Patient reports that his wife has raised concern because patient has recurrent movement during his sleep including moving his arms and legs and occasionally makes noise and startles himself awake.  Patient reports that he does snore.  Patient has some occasional fatigue.  Patient denies any morning headaches.  Patient denies any family history of sleep apnea.  Patient has never had testing for sleep apnea in the past.  Patient denies any history of sleepwalking.  Patient reports that his past medical history is only significant for his anxiety.  Patient smokes approximately half pack per day of cigarettes.  Patient is interested in smoking cessation.  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  Patient reports family history of a paternal uncle with an unknown type of cancer that patient believes was GI in origin but he is not sure of the exact location.  Patient also states that his paternal grandfather died from stomach cancer and also had hypertension and diabetes.  Patient denies any past surgeries.  Past Medical History:  Diagnosis Date  . Anxiety   . Depression    Family History  Problem Relation Age of Onset  . Cancer Paternal Uncle   . Cancer Paternal Grandfather   . Hypertension Paternal Grandfather   . Diabetes Paternal Grandfather    Past Surgical History:  Procedure Laterality Date  . NO PAST SURGERIES     Social History   Tobacco Use  . Smoking status: Current Every Day Smoker    Packs/day: 0.50    Types: Cigarettes  . Smokeless tobacco: Never Used  Substance Use Topics  . Alcohol use: No  . Drug use: No  No Known Allergies  Review of Systems  Constitutional: Positive for fatigue. Negative for chills and fever.  HENT: Negative for ear pain, hearing loss, postnasal drip, sinus pressure, sinus pain, sore throat and trouble swallowing.   Eyes:       Wears glasses  Respiratory: Negative for cough and shortness of breath.   Cardiovascular: Negative for chest pain, palpitations and leg swelling.  Gastrointestinal: Negative for abdominal pain, blood in stool, constipation, diarrhea and nausea.  Endocrine: Negative for cold intolerance, heat intolerance, polydipsia, polyphagia and polyuria.  Genitourinary: Negative for dysuria and frequency.  Musculoskeletal: Negative for arthralgias, back pain and gait problem.  Neurological: Negative for dizziness, weakness, numbness and headaches.       Episode of dizziness x 1-see HPI  Hematological: Negative for adenopathy. Does not bruise/bleed easily.  Psychiatric/Behavioral: Positive for sleep disturbance. Negative for self-injury and suicidal ideas. The patient  is not nervous/anxious.        Objective:   Physical Exam BP 117/72 (BP Location: Left Arm, Patient Position: Sitting, Cuff Size: Large)   Pulse 76   Temp 98.2 F (36.8 C) (Oral)   Resp 18   Ht 5\' 8"  (1.727 m)   Wt 223 lb (101.2 kg)   SpO2 98%   BMI 33.91 kg/m Nurse's notes and vital signs reviewed General-well-nourished, well-developed overweight/obese for height male.  Patient is wearing glasses ENT- TMs within normal, nares with mild edema/erythema of the nasal turbinates, patient with slightly sticky oral mucosa and posterior pharynx erythema along with narrowed posterior airway secondary to large tongue base Neck-supple, patient with borderline thyromegaly, no palpable nodules, no carotid bruit Lungs-clear to auscultation bilaterally, breathing is nonlabored Back-no CVA tenderness Abdomen-soft, non-tender Extremities-no edema Psych- normal mood and judgment, normal affect.  Patient maintains good eye contact and interacts appropriately even laughing at times     Assessment & Plan:  1. Generalized anxiety disorder Patient reports that he is doing well on his current medications for generalized anxiety disorder which is currently stable.  Patient has been taking venlafaxine 150 mg, 2 pills daily as well as Wellbutrin 300 mg once daily.  Patient is slightly exceeding recommended dosage but has been on this medication for some time but at his next visit we will discuss changes in medications to bring the midline with therapeutic guidelines.  Patient will also have CMP and TSH in follow-up of long-term use of medications at today's visit. - Comprehensive metabolic panel - TSH - venlafaxine XR (EFFEXOR-XR) 150 MG 24 hr capsule; Take 2 capsules (300 mg total) by mouth daily with breakfast.  Dispense: 180 capsule; Refill: 1 - buPROPion (WELLBUTRIN XL) 300 MG 24 hr tablet; Take 1 tablet (300 mg total) by mouth daily.  Dispense: 90 tablet; Refill: 1  2. Sleep disturbance Patient with  complaint of daytime fatigue and states that his wife has noticed abnormal nighttime movements.  Patient with a narrowed posterior airway, large neck size and obesity on exam.  Patient also reports snoring.  Patient will be scheduled for sleep study. - [facility] Sleep Study; Future  3. Tobacco use Patient is interested in smoking cessation.  Patient is to look up information on the [place] quits website and patient will follow-up with clinical pharmacist in approximately 2 weeks regarding smoking cessation  4. Encounter for long-term current use of medication Patient will have CMP in follow-up of long-term use of medications for treatment of generalized anxiety disorder - Comprehensive metabolic panel  5. Class 1 obesity without serious comorbidity with body mass index (BMI) of 33.0 to 33.9 in adult, unspecified obesity type Patient with obesity on examination.  Patient will have CMP at today's visit and patient counseled regarding healthy diet and regular exercise with goal of 150 minutes of exercise on a weekly basis.  An After Visit Summary was printed and given to the patient.  Return in about 6 months (around 12/18/2018) for tob  use- 2 weeks with CPP(Luke); GAD-6 months.

## 2018-06-20 LAB — COMPREHENSIVE METABOLIC PANEL WITH GFR
ALT: 35 IU/L (ref 0–44)
AST: 27 IU/L (ref 0–40)
Albumin/Globulin Ratio: 1.7 (ref 1.2–2.2)
Albumin: 4.7 g/dL (ref 4.0–5.0)
Alkaline Phosphatase: 108 IU/L (ref 39–117)
BUN/Creatinine Ratio: 9 (ref 9–20)
BUN: 11 mg/dL (ref 6–24)
Bilirubin Total: 0.3 mg/dL (ref 0.0–1.2)
CO2: 27 mmol/L (ref 20–29)
Calcium: 9.7 mg/dL (ref 8.7–10.2)
Chloride: 97 mmol/L (ref 96–106)
Creatinine, Ser: 1.17 mg/dL (ref 0.76–1.27)
GFR calc Af Amer: 85 mL/min/1.73
GFR calc non Af Amer: 74 mL/min/1.73
Globulin, Total: 2.7 g/dL (ref 1.5–4.5)
Glucose: 88 mg/dL (ref 65–99)
Potassium: 4 mmol/L (ref 3.5–5.2)
Sodium: 138 mmol/L (ref 134–144)
Total Protein: 7.4 g/dL (ref 6.0–8.5)

## 2018-06-20 LAB — TSH: TSH: 2.38 u[IU]/mL (ref 0.450–4.500)

## 2018-06-23 ENCOUNTER — Telehealth: Payer: Self-pay | Admitting: *Deleted

## 2018-06-23 NOTE — Telephone Encounter (Signed)
Medical Assistant left message on patient's home and cell voicemail. Voicemail states to give a call back to Sevilla Murtagh with [facility] at [phone]. !!!Please inform patient of labs being normal!!! 

## 2018-06-23 NOTE — Telephone Encounter (Signed)
-----   Message from Cain Saupe, MD sent at 06/20/2018 10:21 PM EST ----- Please notify patient that his complete metabolic panel and TSH were normal

## 2018-07-06 ENCOUNTER — Encounter: Payer: Self-pay | Admitting: Pharmacist

## 2018-07-06 ENCOUNTER — Ambulatory Visit: Payer: Managed Care, Other (non HMO) | Attending: Family Medicine | Admitting: Pharmacist

## 2018-07-06 DIAGNOSIS — F17210 Nicotine dependence, cigarettes, uncomplicated: Secondary | ICD-10-CM

## 2018-07-06 DIAGNOSIS — Z716 Tobacco abuse counseling: Secondary | ICD-10-CM

## 2018-07-06 MED ORDER — VARENICLINE TARTRATE 0.5 MG X 11 & 1 MG X 42 PO MISC: ORAL | 0 refills | Status: DC

## 2018-07-06 MED ORDER — VARENICLINE TARTRATE 1 MG PO TABS: 1.0000 mg | ORAL_TABLET | Freq: Two times a day (BID) | ORAL | 0 refills | Status: DC

## 2018-07-06 MED ORDER — VARENICLINE TARTRATE 0.5 MG PO TABS: ORAL_TABLET | ORAL | 0 refills | Status: DC

## 2018-07-06 MED FILL — VENLAFAXINE HCL ER 150 MG C: 150 | 30 days supply | Qty: 60 | Fill #0

## 2018-07-06 MED FILL — $CHANTIX 0.5 MG TABLET: 0.5 | 7 days supply | Qty: 11 | Fill #0

## 2018-07-06 MED FILL — !CHANTIX 1 MG TABLET: 1 | 21 days supply | Qty: 42 | Fill #0

## 2018-07-06 NOTE — Patient Instructions (Signed)
Thank you for coming to see [name] today.   Start Chantix:  0.5 mg daily on days 1 through 3  0.5 mg BID on days 4 through 7  1 mg BID daily thereafter   If you have any questions about medications, please call me [phone].  Zachary Lindsey

## 2018-07-06 NOTE — Progress Notes (Signed)
S:  Patient was reviewed by clinical pharmacist for assistance with tobacco cessation.   Tobacco Use History  Age when started using tobacco on a daily basis 22.  Type: cigarettes.  Number of cigarettes per day 7-8, brand [place] menthols.  Estimated nicotine content: ~0.8-1.2 mg/cig or 5.6 - 9.6 mg per day.   Smokes first cigarette 30 minutes after waking.  Does not wake at night to smoke  Fagerstrom Score 3/10.  Triggers include emotional: anxiety.  Quit Attempt History   Most recent quit attempt 5 years ago.  Longest time ever been tobacco free 2-3 weeks.  Methods tried in the past include Bupropion (Zyban).   Rates IMPORTANCE of quitting tobacco on 1-10 scale of 9.  Rates READINESS of quitting tobacco on 1-10 scale of 4.  Rates CONFIDENCE of quitting tobacco on 1-10 scale of 4.  Motivators to quitting include health; barriers include under a lot of stress now   A/P: Nicotine dependence: mild-moderate based on Fagerstrom score , >20 years duration in a patient who is fair candidate for success b/c of his long duration, previously failed tx, and mild to low degree of confidence.     Reviewed options and patient reports interest in Chantix. Initiated Varenicline (Chantix). Estimated duration of tx: 12 weeks.Treatment was reviewed with the patient, including name, instructions, goals of therapy, potential side effects, importance of adherence, and safe use.  Of note, pt due for Pneumovax, Adacel, and influenza vaccinations. Pt deferred to address at next visit.   Reviewed potential challenges and coping skills/strategies with patient. Provided information on 1 800-QUIT NOW support program and advised patient to contact me if questions/concerns arise. Patient verbalized understanding of information by repeating back.  Follow up in 1 month(s)  Horald Pollen [place] 8:39 AM 07/06/2018

## 2018-07-22 MED FILL — BUPROPION HCL XL 300 MG TAB: 300 | 30 days supply | Qty: 30 | Fill #2

## 2018-07-28 ENCOUNTER — Ambulatory Visit: Payer: Self-pay | Admitting: Internal Medicine

## 2018-08-03 ENCOUNTER — Encounter: Payer: Self-pay | Admitting: Family Medicine

## 2018-08-03 ENCOUNTER — Telehealth: Payer: Self-pay | Admitting: Family Medicine

## 2018-08-03 NOTE — Telephone Encounter (Signed)
Patient called stating that he has been having anxiety at his work place because of COVID-19 and because he is a Production designer, theatre/television/film for [employer]. Patient states that his employer has given the employees an opportunity to take 2 weeks off from work. Patient would like a letter form his PCP saying that because of his anxiety he can be off for two week. If possible patient would like the letter emailed.  Please f/u

## 2018-08-03 NOTE — Telephone Encounter (Signed)
Letter has been typed and printed and patient can be notified

## 2018-08-03 NOTE — Telephone Encounter (Signed)
Patient states he would like a letter that states due to his anxiety he needs to be off for the two weeks.

## 2018-08-03 NOTE — Telephone Encounter (Signed)
Please construct if appropriate. We can fax to the employer and mail hard copy to patient.

## 2018-08-03 NOTE — Telephone Encounter (Signed)
Please ask patient if he wants his employer to know that he has issues with anxiety-does he specifically want anxiety mentioned in the letter? Otherwise if his employer is offering the chance to take two weeks off then can he take the time off without an excuse/letter?

## 2018-08-03 NOTE — Progress Notes (Signed)
See phone messages. Patient left phone message that he would like a note for his employer so that he can stay out of work for the next two weeks due to concerns regarding COVID-19 as he employer has offered 2 week leave.

## 2018-08-03 NOTE — Telephone Encounter (Signed)
MA contacted patient with no answer. LVM to provide a fax number for employer and to obtain the hard copy by mail or pick up from the front office screener.

## 2018-08-04 NOTE — Telephone Encounter (Signed)
Patient states he has received the letter and believes it is  More asking for the two weeks off and believes the letter could me misinterpreted and would like to know if the letter could be changed to explain more because of his anxiety of everything going on as well as the issues he has been seen for at the clinic. Please follow up.

## 2018-08-04 NOTE — Telephone Encounter (Signed)
MA spoke with patient. Patient is needing letter to state, Patient has a history of anxiety which has been managed in our office. At this time patients anxiety has been heightened due to the current COVID-19 pandemic. Patient would benefit from utilizing the allotted time his employer is offering to take off and manage this stress level.

## 2018-08-10 ENCOUNTER — Ambulatory Visit: Payer: Managed Care, Other (non HMO) | Attending: Family Medicine | Admitting: Pharmacist

## 2018-08-10 ENCOUNTER — Encounter: Payer: Self-pay | Admitting: Pharmacist

## 2018-08-10 ENCOUNTER — Other Ambulatory Visit: Payer: Self-pay

## 2018-08-10 DIAGNOSIS — Z716 Tobacco abuse counseling: Secondary | ICD-10-CM

## 2018-08-10 MED FILL — VENLAFAXINE HCL ER 150 MG C: 150 | 15 days supply | Qty: 30 | Fill #1

## 2018-08-10 NOTE — Progress Notes (Signed)
S:  Patient was reviewed by clinical pharmacist for assistance with tobacco cessation.   Tobacco Use History  Age when started using tobacco on a daily basis 22.  Type: cigarettes.  Number of cigarettes per day 7-8, brand [place] menthols - Pt reports success with Chantix; was down to ~3 cigarettes a day within 1 week of therapy but stopped d/t anxiety. States that his anxiety was increased b/c of covid and personal concern.    Estimated nicotine content: ~0.8-1.2 mg/cig or 5.6 - 9.6 mg per day.   Smokes first cigarette 30 minutes after waking.  Does not wake at night to smoke  Fagerstrom Score 3/10.  Triggers include emotional: anxiety.  Quit Attempt History   Most recent quit attempt 5 years ago.  Longest time ever been tobacco free 2-3 weeks.  Methods tried in the past include Bupropion (Zyban).   Rates IMPORTANCE of quitting tobacco on 1-10 scale of 9.  Rates READINESS of quitting tobacco on 1-10 scale of 4.  Rates CONFIDENCE of quitting tobacco on 1-10 scale of 4.  Motivators to quitting include health; barriers include under a lot of stress now   A/P: Nicotine dependence: mild-moderate based on Fagerstrom score , >20 years duration in a patient who is fair candidate for success b/c of his long duration, previously failed tx, and mild to low degree of confidence.     Reviewed options and patient reports interest in Chantix, however, he wishes to wait to restart. I am encouraged d/t to his reported success when stating Chantix. We will wait to restart Varenicline (Chantix) per pt request.  Of note, pt due for Pneumovax, Adacel, and influenza vaccinations. Pt deferred to address at next visit.   Reviewed potential challenges and coping skills/strategies with patient. Provided information on 1 800-QUIT NOW support program and advised patient to contact me if questions/concerns arise. Patient verbalized understanding of information by repeating back.  Follow up in 1  month(s)  Horald Pollen [place] 9:46 AM 08/10/2018

## 2018-08-13 ENCOUNTER — Encounter: Payer: Self-pay | Admitting: Family Medicine

## 2018-08-13 NOTE — Progress Notes (Signed)
Patient ID: Zachary Lindsey, male   DOB: 10-Dec-1970, 48 y.o.   MRN: [id number]   See phone encounter. Patient requested note to be out of work/work from home due to the current COVID-19 pandemic which has worsened his anxiety level and the initial letter was not what the patient wanted therefore letter has been re-written as per new suggestion left by the patient and patient will be contacted to see if he wants to pick-up letter, have letter faxed to his employer or mailed to the patient.

## 2018-08-13 NOTE — Telephone Encounter (Signed)
Letter has been printed, please contact patient regarding letter completion. Letter has been placed in your mailbox

## 2018-08-14 ENCOUNTER — Telehealth: Payer: Self-pay | Admitting: *Deleted

## 2018-08-14 NOTE — Telephone Encounter (Signed)
Called patient to inform him that his letter is ready for pick up. [facility] informing patient with this information and office number was provided on voicemail.

## 2018-09-08 NOTE — Progress Notes (Signed)
S:  Patient was reviewed by clinical pharmacist for assistance with tobacco cessation.   Tobacco Use History  Age when started using tobacco on a daily basis 22.  Type: cigarettes.  Number of cigarettes per day: 7 - 10   Estimated nicotine content: ~0.8-1.2 mg/cig or 5.6 - 9.6 mg per day.   Smokes first cigarette: after first 30 minutes upon awakening.   Does not wake at night to smoke  Fagerstrom Score 3/10.  Triggers include emotional: anxiety.  Quit Attempt History   Most recent quit attempt 5 years ago.  Longest time ever been tobacco free 2-3 weeks.  Methods tried in the past include Bupropion (Zyban).   Rates IMPORTANCE of quitting tobacco on 1-10 scale of 10.  Rates READINESS of quitting tobacco on 1-10 scale of 10.  Rates CONFIDENCE of quitting tobacco on 1-10 scale of 10.  Motivators to quitting include health; barriers include under a lot of stress now   A/P: Nicotine dependence: mild-moderate based on Fagerstrom score , >20 years duration in a patient who is good candidate for success b/c of previous success when on Chantix.  Reviewed options and patient reports interest in restarting Chantix. Will initiate Chantix and follow-up in 1 month. Goals of therapy reviewed. Counseling provided.    Of note, pt due for Pneumovax, Adacel, and influenza vaccinations. Pt deferred to address at next visit.   Reviewed potential challenges and coping skills/strategies with patient. Provided information on 1 800-QUIT NOW support program and advised patient to contact me if questions/concerns arise. Patient verbalized understanding of information by repeating back.  Follow up in 1 month(s)  Horald Pollen Ausdall 10:50 AM 09/09/2018

## 2018-09-09 ENCOUNTER — Ambulatory Visit: Payer: Managed Care, Other (non HMO) | Attending: Family Medicine | Admitting: Pharmacist

## 2018-09-09 ENCOUNTER — Encounter: Payer: Self-pay | Admitting: Pharmacist

## 2018-09-09 ENCOUNTER — Other Ambulatory Visit: Payer: Self-pay

## 2018-09-09 DIAGNOSIS — Z716 Tobacco abuse counseling: Secondary | ICD-10-CM

## 2018-09-09 MED ORDER — VARENICLINE TARTRATE 1 MG PO TABS: 1.0000 mg | ORAL_TABLET | Freq: Two times a day (BID) | ORAL | 1 refills | Status: AC

## 2018-09-09 MED ORDER — VARENICLINE TARTRATE 0.5 MG PO TABS: ORAL_TABLET | ORAL | 0 refills | Status: AC

## 2018-09-09 MED FILL — CHANTIX 0.5 MG TABLET: 0.5 | 7 days supply | Qty: 11 | Fill #0

## 2018-09-09 MED FILL — VENLAFAXINE HCL ER 150 MG C: 150 | 15 days supply | Qty: 30 | Fill #2

## 2018-09-09 MED FILL — BUPROPION HCL XL 300 MG TAB: 300 | 30 days supply | Qty: 30 | Fill #0

## 2018-09-09 MED FILL — CHANTIX 1 MG TABLET: 1 | 28 days supply | Qty: 56 | Fill #0

## 2018-09-09 NOTE — Patient Instructions (Signed)
Thank you for coming to see [name] today.   Please start Chantix.   Take one 0.5 mg tablet once a day for 3 days, then increase to one 0.5 mg tablet twice a day for 4 days.   Then increase 1 mg twice daily thereafter.   I will see you in 1 month.

## 2018-10-01 MED FILL — BUPROPION HCL XL 300 MG TAB: 300 | 30 days supply | Qty: 30 | Fill #1

## 2018-10-01 MED FILL — VENLAFAXINE HCL ER 150 MG C: 150 | 30 days supply | Qty: 60 | Fill #3

## 2018-11-16 MED FILL — VENLAFAXINE HCL ER 150 MG C: 150 | 30 days supply | Qty: 60 | Fill #4

## 2020-04-16 ENCOUNTER — Other Ambulatory Visit: Payer: Self-pay

## 2020-04-16 ENCOUNTER — Encounter (HOSPITAL_COMMUNITY): Payer: Self-pay

## 2020-04-16 ENCOUNTER — Emergency Department (HOSPITAL_COMMUNITY)
Admission: EM | Admit: 2020-04-16 | Discharge: 2020-04-16 | Disposition: A | Discharge status: Home / Self Care | Payer: Self-pay | Attending: Emergency Medicine | Admitting: Emergency Medicine

## 2020-04-16 DIAGNOSIS — K0889 Other specified disorders of teeth and supporting structures: Secondary | ICD-10-CM | POA: Insufficient documentation

## 2020-04-16 DIAGNOSIS — F17210 Nicotine dependence, cigarettes, uncomplicated: Secondary | ICD-10-CM | POA: Insufficient documentation

## 2020-04-16 MED ORDER — PENICILLIN V POTASSIUM 500 MG PO TABS
500.0000 mg | ORAL_TABLET | Freq: Once | ORAL | Status: AC
  Administered 2020-04-16: 500 mg via ORAL
  Filled 2020-04-16: qty 1

## 2020-04-16 MED ORDER — PENICILLIN V POTASSIUM 500 MG PO TABS: 500.0000 mg | ORAL_TABLET | Freq: Four times a day (QID) | ORAL | 0 refills | Status: AC

## 2020-04-16 NOTE — ED Provider Notes (Signed)
[facility]-EMERGENCY DEPT Provider Note   CSN: [id number] Arrival date & time: 04/16/20  2127     History Chief Complaint  Patient presents with  . Dental Pain    Zachary Lindsey is a 49 y.o. male.  HPI   49 y/o male with a h/o anxiety/depression who presents to the ED today for eval of dental pain. States pain has been ongoing for several weeks and he has tried OTC meds without significant relief. Pain is constant and severe in nature. Denies associated fevers or other systemic symptoms. Does not have a dentist.  Past Medical History:  Diagnosis Date  . Anxiety   . Depression     Patient Active Problem List   Diagnosis Date Noted  . Generalized anxiety disorder 06/19/2018  . Tobacco use 06/19/2018    Past Surgical History:  Procedure Laterality Date  . NO PAST SURGERIES         Family History  Problem Relation Age of Onset  . Cancer Paternal Uncle   . Cancer Paternal Grandfather   . Hypertension Paternal Grandfather   . Diabetes Paternal Grandfather     Social History   Tobacco Use  . Smoking status: Current Every Day Smoker    Packs/day: 0.50    Types: Cigarettes  . Smokeless tobacco: Never Used  Substance Use Topics  . Alcohol use: No  . Drug use: No    Home Medications Prior to Admission medications   Medication Sig Start Date End Date Taking? Authorizing Provider  buPROPion (WELLBUTRIN XL) 300 MG 24 hr tablet Take 1 tablet (300 mg total) by mouth daily. 06/19/18   Fulp, Cammie, MD  penicillin v potassium (VEETID) 500 MG tablet Take 1 tablet (500 mg total) by mouth 4 (four) times daily for 7 days. 04/16/20 04/23/20  Diyana Starrett S, PA-C  varenicline (CHANTIX) 0.5 MG tablet Take one tablet by mouth once daily for 3 days then increase to one tablet twice daily for 4 days. 09/09/18   Fulp, Cammie, MD  varenicline (CHANTIX) 1 MG tablet Take 1 tablet (1 mg total) by mouth 2 (two) times daily. 09/09/18   Fulp, Cammie, MD  venlafaxine  XR (EFFEXOR-XR) 150 MG 24 hr capsule Take 2 capsules (300 mg total) by mouth daily with breakfast. 06/19/18   Cain Saupe, MD    Allergies    Patient has no known allergies.  Review of Systems   Review of Systems  Constitutional: Negative for fever.  HENT: Positive for dental problem.   Neurological: Negative for headaches.    Physical Exam Updated Vital Signs BP (!) 136/103   Pulse 84   Temp 98 F (36.7 C) (Oral)   Resp 18   SpO2 98%   Physical Exam Constitutional:      General: He is not in acute distress.    Appearance: He is well-developed and well-nourished.  HENT:     Mouth/Throat:     Comments: Several teeth missing to the left lower mouth. Dental carie noted to tooth number 21 and this tooth is tender to percussion. No periapical abscess noted. No sublingual or submandibular swelling is noted.  Eyes:     Conjunctiva/sclera: Conjunctivae normal.  Cardiovascular:     Rate and Rhythm: Normal rate.  Pulmonary:     Effort: Pulmonary effort is normal.  Skin:    General: Skin is warm and dry.  Neurological:     Mental Status: He is alert and oriented to person, place, and time.  ED Results / Procedures / Treatments   Labs (all labs ordered are listed, but only abnormal results are displayed) Labs Reviewed - No data to display  EKG None  Radiology No results found.  Procedures Procedures (including critical care time)  Medications Ordered in ED Medications - No data to display  ED Course  I have reviewed the triage vital signs and the nursing notes.  Pertinent labs & imaging results that were available during my care of the patient were reviewed by me and considered in my medical decision making (see chart for details).    MDM Rules/Calculators/A&P                          Patient with toothache.  No gross abscess.  Exam unconcerning for Ludwig's angina or spread of infection.  Will treat with penicillin and pain medicine.  Urged patient to  follow-up with dentist.  Resources given. Advised to return if worse.   Final Clinical Impression(s) / ED Diagnoses Final diagnoses:  Pain, dental    Rx / DC Orders ED Discharge Orders         Ordered    penicillin v potassium (VEETID) 500 MG tablet  4 times daily        04/16/20 2219           Karrie Meres, PA-C 04/16/20 2219    Mancel Bale, MD 04/17/20 1827

## 2020-04-16 NOTE — Discharge Instructions (Signed)
You were given a prescription for antibiotics. Please take the antibiotic prescription fully.   Please follow-up with a dentist in the next 5 to 7 days for reevaluation.  If you do not have a dentist, resources were provided for dentist in the area in your discharge summary.  Please contact one of the offices that are listed and make an appointment for follow-up.  Please return to the emergency department for any new or worsening symptoms.  

## 2020-04-16 NOTE — ED Triage Notes (Signed)
Patient arrived with complaints of left bottom tooth ache over the last month, treating with goodie powder but states it stopped working last week. Tooth is broken.

## 2021-01-13 ENCOUNTER — Other Ambulatory Visit: Payer: Self-pay

## 2021-01-13 ENCOUNTER — Encounter (HOSPITAL_COMMUNITY): Payer: Self-pay | Admitting: Emergency Medicine

## 2021-01-13 ENCOUNTER — Emergency Department (HOSPITAL_COMMUNITY)
Admission: EM | Admit: 2021-01-13 | Discharge: 2021-01-13 | Disposition: A | Discharge status: Home / Self Care | Payer: Managed Care, Other (non HMO) | Attending: Emergency Medicine | Admitting: Emergency Medicine

## 2021-01-13 DIAGNOSIS — K0889 Other specified disorders of teeth and supporting structures: Secondary | ICD-10-CM | POA: Diagnosis not present

## 2021-01-13 DIAGNOSIS — F17210 Nicotine dependence, cigarettes, uncomplicated: Secondary | ICD-10-CM | POA: Insufficient documentation

## 2021-01-13 MED ORDER — LIDOCAINE VISCOUS HCL 2 % MT SOLN
15.0000 mL | Freq: Once | OROMUCOSAL | Status: AC
  Administered 2021-01-13: 15 mL via OROMUCOSAL
  Filled 2021-01-13: qty 15

## 2021-01-13 MED ORDER — PENICILLIN V POTASSIUM 500 MG PO TABS
500.0000 mg | ORAL_TABLET | Freq: Once | ORAL | Status: AC
  Administered 2021-01-13: 500 mg via ORAL
  Filled 2021-01-13: qty 1

## 2021-01-13 MED ORDER — KETOROLAC TROMETHAMINE 60 MG/2ML IM SOLN
30.0000 mg | Freq: Once | INTRAMUSCULAR | Status: AC
  Administered 2021-01-13: 30 mg via INTRAMUSCULAR
  Filled 2021-01-13: qty 2

## 2021-01-13 MED ORDER — PENICILLIN V POTASSIUM 500 MG PO TABS: 500.0000 mg | ORAL_TABLET | Freq: Four times a day (QID) | ORAL | 0 refills | Status: AC

## 2021-01-13 MED ORDER — LIDOCAINE VISCOUS HCL 2 % MT SOLN: 15.0000 mL | OROMUCOSAL | 0 refills | Status: AC | PRN

## 2021-01-13 NOTE — Discharge Instructions (Addendum)
Thank you for allowing me to care for you today in the Emergency Department.   Take 650 mg of Tylenol or 600 mg of ibuprofen with food every 6 hours for pain.  You can alternate between these 2 medications every 3 hours if your pain returns.  For instance, you can take Tylenol at noon, followed by a dose of ibuprofen at 3, followed by second dose of Tylenol and 6.  Take 1 tablet of penicillin every 6 hours for the next 5 days.  You can in the ED is 15 mL of viscous lidocaine every 3 hours as needed for pain.  Return to the emergency department if you become unable to open your mouth, if you cannot swallow your own saliva, if you develop significant swelling to the face, difficulty breathing, if you start having fevers, temperature of 100.4 F or greater with significant drainage from the tooth, or other new, concerning symptoms.

## 2021-01-13 NOTE — ED Provider Notes (Signed)
[facility]-EMERGENCY DEPT Provider Note   CSN: [id number] Arrival date & time: 01/13/21  0410     History Chief Complaint  Patient presents with   Dental Pain    Zachary Lindsey is a 50 y.o. male with history of anxiety who presents the emergency department with a chief complaint of dental pain.  The patient reports left lower dental pain that has been worsening over the last few days.  Reports that he always has some mild pain to the left lower tooth and has been seen by his dentist.  Reports that he recently obtained dental insurance about a month ago, but was told that if he had the tooth extracted and followed the recommendations from his dentist that it would not be covered until he had insurance for at least 6 months.  He denies fever, chills, facial swelling, trismus, drooling, drainage, difficulty swallowing, shortness of breath, neck pain or stiffness.  He has been taking Motrin without improvement in his symptoms.  He denies any recent dental injuries.  The history is provided by the patient and medical records. No language interpreter was used.      Past Medical History:  Diagnosis Date   Anxiety    Depression     Patient Active Problem List   Diagnosis Date Noted   Generalized anxiety disorder 06/19/2018   Tobacco use 06/19/2018    Past Surgical History:  Procedure Laterality Date   NO PAST SURGERIES         Family History  Problem Relation Age of Onset   Cancer Paternal Uncle    Cancer Paternal Grandfather    Hypertension Paternal Grandfather    Diabetes Paternal Grandfather     Social History   Tobacco Use   Smoking status: Every Day    Packs/day: 0.50    Types: Cigarettes   Smokeless tobacco: Never  Substance Use Topics   Alcohol use: No   Drug use: No    Home Medications Prior to Admission medications   Medication Sig Start Date End Date Taking? Authorizing Provider  lidocaine (XYLOCAINE) 2 % solution Use as directed  15 mLs in the mouth or throat as needed for mouth pain. 01/13/21  Yes Webber Michiels A, PA-C  penicillin v potassium (VEETID) 500 MG tablet Take 1 tablet (500 mg total) by mouth 4 (four) times daily for 5 days. 01/13/21 01/18/21 Yes Lucette Kratz A, PA-C  buPROPion (WELLBUTRIN XL) 300 MG 24 hr tablet Take 1 tablet (300 mg total) by mouth daily. 06/19/18   Fulp, Cammie, MD  varenicline (CHANTIX) 0.5 MG tablet Take one tablet by mouth once daily for 3 days then increase to one tablet twice daily for 4 days. 09/09/18   Fulp, Cammie, MD  varenicline (CHANTIX) 1 MG tablet Take 1 tablet (1 mg total) by mouth 2 (two) times daily. 09/09/18   Fulp, Cammie, MD  venlafaxine XR (EFFEXOR-XR) 150 MG 24 hr capsule Take 2 capsules (300 mg total) by mouth daily with breakfast. 06/19/18   Cain Saupe, MD    Allergies    Patient has no known allergies.  Review of Systems   Review of Systems  Constitutional:  Negative for activity change, chills and fever.  HENT:  Positive for dental problem. Negative for ear pain and sore throat.   Respiratory:  Negative for shortness of breath and wheezing.   Cardiovascular:  Negative for chest pain and palpitations.  Gastrointestinal:  Negative for abdominal pain, diarrhea, nausea and vomiting.  Musculoskeletal:  Negative for back pain and myalgias.  Skin:  Negative for rash and wound.  Neurological:  Negative for seizures, syncope and weakness.   Physical Exam Updated Vital Signs BP (!) 153/107 (BP Location: Right Arm)   Pulse 76   Temp 97.8 F (36.6 C) (Oral)   Resp 18   SpO2 100%   Physical Exam Vitals and nursing note reviewed.  Constitutional:      Appearance: He is well-developed.  HENT:     Head: Normocephalic.     Comments: No sublingual edema.  No trismus.  Tolerating secretions without difficulty.  Normal phonation.  Posterior oropharynx is unremarkable.    Nose: No congestion or rhinorrhea.     Mouth/Throat:     Comments: Tender to palpation to the left  mandibular molar.  No active drainage or drainage able to be expressed.  There is some swelling noted to the buccal gingiva, but no fluctuance.  Patient reports that he bit his cheek prior to the onset of swelling. Eyes:     Conjunctiva/sclera: Conjunctivae normal.  Cardiovascular:     Rate and Rhythm: Normal rate and regular rhythm.     Heart sounds: No murmur heard. Pulmonary:     Effort: Pulmonary effort is normal.  Abdominal:     General: There is no distension.     Palpations: Abdomen is soft.  Musculoskeletal:     Cervical back: Neck supple.  Skin:    General: Skin is warm and dry.  Neurological:     Mental Status: He is alert.  Psychiatric:        Behavior: Behavior normal.    ED Results / Procedures / Treatments   Labs (all labs ordered are listed, but only abnormal results are displayed) Labs Reviewed - No data to display  EKG None  Radiology No results found.  Procedures Procedures   Medications Ordered in ED Medications  ketorolac (TORADOL) injection 30 mg (has no administration in time range)  penicillin v potassium (VEETID) tablet 500 mg (has no administration in time range)  lidocaine (XYLOCAINE) 2 % viscous mouth solution 15 mL (has no administration in time range)    ED Course  I have reviewed the triage vital signs and the nursing notes.  Pertinent labs & imaging results that were available during my care of the patient were reviewed by me and considered in my medical decision making (see chart for details).    MDM Rules/Calculators/A&P                           Patient with toothache.  No gross abscess.  Exam unconcerning for Ludwig's angina or spread of infection.  Will treat with penicillin and pain medicine.  Urged patient to follow-up with dentist.     Final Clinical Impression(s) / ED Diagnoses Final diagnoses:  Dentalgia    Rx / DC Orders ED Discharge Orders          Ordered    penicillin v potassium (VEETID) 500 MG tablet  4  times daily        01/13/21 0504    lidocaine (XYLOCAINE) 2 % solution  As needed        01/13/21 0504             Ladawna Walgren A, PA-C 01/13/21 0514    Koleen Distance, MD 01/13/21 [id number]

## 2021-01-13 NOTE — ED Triage Notes (Signed)
Patient arrives complaining of left lower dental pain. Patient states he has had issues with this tooth but ibuprofen has been helping. Patient states last 2 hours, medication is not helping. Complaining of throbbing. No fevers, N/V.
